# Patient Record
Sex: Male | Born: 1955 | Hispanic: No | Marital: Married | Smoking: Never smoker
Health system: Southern US, Community
[De-identification: ages and names within clinical notes are randomized; demographics above are authoritative.]

## PROBLEM LIST (undated history)

## (undated) DIAGNOSIS — C801 Malignant (primary) neoplasm, unspecified: Secondary | ICD-10-CM

## (undated) DIAGNOSIS — G4730 Sleep apnea, unspecified: Secondary | ICD-10-CM

## (undated) DIAGNOSIS — Z87442 Personal history of urinary calculi: Secondary | ICD-10-CM

## (undated) DIAGNOSIS — E039 Hypothyroidism, unspecified: Secondary | ICD-10-CM

## (undated) DIAGNOSIS — I10 Essential (primary) hypertension: Secondary | ICD-10-CM

## (undated) DIAGNOSIS — K219 Gastro-esophageal reflux disease without esophagitis: Secondary | ICD-10-CM

## (undated) DIAGNOSIS — M1990 Unspecified osteoarthritis, unspecified site: Secondary | ICD-10-CM

## (undated) DIAGNOSIS — I499 Cardiac arrhythmia, unspecified: Secondary | ICD-10-CM

## (undated) HISTORY — PX: KNEE CARTILAGE SURGERY: SHX688

## (undated) HISTORY — PX: CARDIAC CATHETERIZATION: SHX172

## (undated) HISTORY — PX: PROSTATE SURGERY: SHX751

## (undated) HISTORY — PX: CHOLECYSTECTOMY: SHX55

## (undated) HISTORY — PX: APPENDECTOMY: SHX54

---

## 2017-10-05 ENCOUNTER — Other Ambulatory Visit: Payer: Self-pay | Admitting: Orthopedic Surgery

## 2017-11-22 NOTE — Pre-Procedure Instructions (Signed)
[facility]  11/22/2017      [facility] [place] [address] [place] [address] [place] [address] Phone: [phone] Fax: [phone]  [facility] [place], [place] [address] [place] [address] Phone: [phone] Fax: [phone]    Your procedure is scheduled on  Thursday 11/29/17  Report to [place] at 800 A.M.  Call this number if you have problems the morning of surgery:  [phone]   Remember:  Do not eat food or drink liquids after midnight.  Take these medicines the morning of surgery with A SIP OF WATER  ALPRAZOLAM, AMLODIPINE (NORVASC), GABAPENTIN (NEURONTIN), LEVOTHYROXINE, OMEPRAZOLE, PREDNISONE   7 days prior to surgery STOP taking any Aspirin(unless otherwise instructed by your surgeon), Aleve, Naproxen, Ibuprofen, Motrin, Advil, Goody's, BC's, all herbal medications, fish oil, and all vitamins    Do not wear jewelry, make-up or nail polish.  Do not wear lotions, powders, or perfumes, or deodorant.  Do not shave 48 hours prior to surgery.  Men may shave face and neck.  Do not bring valuables to the [facility].  [facility] is not responsible for any belongings or valuables.  Contacts, dentures or bridgework may not be worn into surgery.  Leave your suitcase in the car.  After surgery it may be brought to your room.  For patients admitted to the [facility], discharge time will be determined by your treatment team.  Patients discharged the day of surgery will not be allowed to drive home.   Name and phone number of your driver:    Special instructions:  [facility] - Preparing for Surgery  Before surgery, you can play an important role.  Because skin is not sterile, your skin needs to be as free of germs as possible.  You can reduce the number of germs on you skin by washing with CHG (chlorahexidine gluconate) soap before  surgery.  CHG is an antiseptic cleaner which kills germs and bonds with the skin to continue killing germs even after washing.  Please DO NOT use if you have an allergy to CHG or antibacterial soaps.  If your skin becomes reddened/irritated stop using the CHG and inform your nurse when you arrive at Short Stay.  Do not shave (including legs and underarms) for at least 48 hours prior to the first CHG shower.  You may shave your face.  Please follow these instructions carefully:   1.  Shower with CHG Soap the night before surgery and the                                morning of Surgery.  2.  If you choose to wash your hair, wash your hair first as usual with your       normal shampoo.  3.  After you shampoo, rinse your hair and body thoroughly to remove the                      Shampoo.  4.  Use CHG as you would any other liquid soap.  You can apply chg directly       to the skin and wash gently with scrungie or a clean washcloth.  5.  Apply the CHG Soap to your body ONLY FROM THE NECK DOWN.  Do not use on open wounds or open sores.  Avoid contact with your eyes,       ears, mouth and genitals (private parts).  Wash genitals (private parts)       with your normal soap.  6.  Wash thoroughly, paying special attention to the area where your surgery        will be performed.  7.  Thoroughly rinse your body with warm water from the neck down.  8.  DO NOT shower/wash with your normal soap after using and rinsing off       the CHG Soap.  9.  Pat yourself dry with a clean towel.            10.  Wear clean pajamas.            11.  Place clean sheets on your bed the night of your first shower and do not        sleep with pets.  Day of Surgery  Do not apply any lotions/deoderants the morning of surgery.  Please wear clean clothes to the [facility]/surgery center.    Please read over the following fact sheets that you were given. MRSA Information and Surgical Site Infection Prevention

## 2017-11-23 ENCOUNTER — Encounter (HOSPITAL_COMMUNITY)
Admission: RE | Admit: 2017-11-23 | Discharge: 2017-11-23 | Disposition: A | Discharge status: Home / Self Care | Payer: Worker's Compensation | Source: Ambulatory Visit | Attending: Orthopedic Surgery | Admitting: Orthopedic Surgery

## 2017-11-23 ENCOUNTER — Other Ambulatory Visit: Payer: Self-pay

## 2017-11-23 ENCOUNTER — Encounter (HOSPITAL_COMMUNITY): Payer: Self-pay

## 2017-11-23 ENCOUNTER — Ambulatory Visit (HOSPITAL_COMMUNITY)
Admission: RE | Admit: 2017-11-23 | Discharge: 2017-11-23 | Disposition: A | Discharge status: Home / Self Care | Payer: Worker's Compensation | Source: Ambulatory Visit | Attending: Orthopedic Surgery | Admitting: Orthopedic Surgery

## 2017-11-23 DIAGNOSIS — Z01818 Encounter for other preprocedural examination: Secondary | ICD-10-CM

## 2017-11-23 DIAGNOSIS — Z7982 Long term (current) use of aspirin: Secondary | ICD-10-CM | POA: Insufficient documentation

## 2017-11-23 DIAGNOSIS — M1990 Unspecified osteoarthritis, unspecified site: Secondary | ICD-10-CM | POA: Insufficient documentation

## 2017-11-23 DIAGNOSIS — Z8546 Personal history of malignant neoplasm of prostate: Secondary | ICD-10-CM | POA: Diagnosis not present

## 2017-11-23 DIAGNOSIS — Z9049 Acquired absence of other specified parts of digestive tract: Secondary | ICD-10-CM | POA: Diagnosis not present

## 2017-11-23 DIAGNOSIS — I10 Essential (primary) hypertension: Secondary | ICD-10-CM | POA: Insufficient documentation

## 2017-11-23 DIAGNOSIS — K219 Gastro-esophageal reflux disease without esophagitis: Secondary | ICD-10-CM | POA: Insufficient documentation

## 2017-11-23 DIAGNOSIS — Z79899 Other long term (current) drug therapy: Secondary | ICD-10-CM | POA: Diagnosis not present

## 2017-11-23 DIAGNOSIS — E039 Hypothyroidism, unspecified: Secondary | ICD-10-CM | POA: Diagnosis not present

## 2017-11-23 DIAGNOSIS — Z87442 Personal history of urinary calculi: Secondary | ICD-10-CM | POA: Diagnosis not present

## 2017-11-23 DIAGNOSIS — G4733 Obstructive sleep apnea (adult) (pediatric): Secondary | ICD-10-CM | POA: Diagnosis not present

## 2017-11-23 DIAGNOSIS — Z6841 Body Mass Index (BMI) 40.0 and over, adult: Secondary | ICD-10-CM | POA: Insufficient documentation

## 2017-11-23 DIAGNOSIS — R9431 Abnormal electrocardiogram [ECG] [EKG]: Secondary | ICD-10-CM | POA: Diagnosis not present

## 2017-11-23 DIAGNOSIS — I493 Ventricular premature depolarization: Secondary | ICD-10-CM | POA: Diagnosis not present

## 2017-11-23 HISTORY — DX: Hypothyroidism, unspecified: E03.9

## 2017-11-23 HISTORY — DX: Unspecified osteoarthritis, unspecified site: M19.90

## 2017-11-23 HISTORY — DX: Essential (primary) hypertension: I10

## 2017-11-23 HISTORY — DX: Personal history of urinary calculi: Z87.442

## 2017-11-23 HISTORY — DX: Malignant (primary) neoplasm, unspecified: C80.1

## 2017-11-23 HISTORY — DX: Gastro-esophageal reflux disease without esophagitis: K21.9

## 2017-11-23 HISTORY — DX: Sleep apnea, unspecified: G47.30

## 2017-11-23 HISTORY — DX: Cardiac arrhythmia, unspecified: I49.9

## 2017-11-23 LAB — TYPE AND SCREEN
ABO/RH(D): B POS
ANTIBODY SCREEN: NEGATIVE

## 2017-11-23 LAB — COMPREHENSIVE METABOLIC PANEL
ALBUMIN: 3.9 g/dL (ref 3.5–5.0)
ALT: 32 U/L (ref 17–63)
ANION GAP: 11 (ref 5–15)
AST: 28 U/L (ref 15–41)
Alkaline Phosphatase: 75 U/L (ref 38–126)
BILIRUBIN TOTAL: 0.9 mg/dL (ref 0.3–1.2)
BUN: 15 mg/dL (ref 6–20)
CHLORIDE: 102 mmol/L (ref 101–111)
CO2: 25 mmol/L (ref 22–32)
Calcium: 9.3 mg/dL (ref 8.9–10.3)
Creatinine, Ser: 0.93 mg/dL (ref 0.61–1.24)
GFR calc Af Amer: >60 mL/min (ref >60)
Glucose, Bld: 120 mg/dL — ABNORMAL HIGH (ref 65–99)
POTASSIUM: 3.4 mmol/L — AB (ref 3.5–5.1)
Sodium: 138 mmol/L (ref 135–145)
TOTAL PROTEIN: 7.1 g/dL (ref 6.5–8.1)

## 2017-11-23 LAB — CBC WITH DIFFERENTIAL/PLATELET
BASOS ABS: 0 10*3/uL (ref 0.0–0.1)
BASOS PCT: 0 %
Eosinophils Absolute: 0.2 10*3/uL (ref 0.0–0.7)
Eosinophils Relative: 2 %
HEMATOCRIT: 46.3 % (ref 39.0–52.0)
Hemoglobin: 16.1 g/dL (ref 13.0–17.0)
Lymphocytes Relative: 20 %
Lymphs Abs: 2.1 10*3/uL (ref 0.7–4.0)
MCH: 31.5 pg (ref 26.0–34.0)
MCHC: 34.8 g/dL (ref 30.0–36.0)
MCV: 90.6 fL (ref 78.0–100.0)
MONO ABS: 0.6 10*3/uL (ref 0.1–1.0)
MONOS PCT: 6 %
NEUTROS ABS: 7.7 10*3/uL (ref 1.7–7.7)
Neutrophils Relative %: 72 %
PLATELETS: 250 10*3/uL (ref 150–400)
RBC: 5.11 MIL/uL (ref 4.22–5.81)
RDW: 13.9 % (ref 11.5–15.5)
WBC: 10.5 10*3/uL (ref 4.0–10.5)

## 2017-11-23 LAB — SURGICAL PCR SCREEN
MRSA, PCR: NEGATIVE
STAPHYLOCOCCUS AUREUS: NEGATIVE

## 2017-11-23 LAB — APTT: APTT: 30 s (ref 24–36)

## 2017-11-23 LAB — PROTIME-INR
INR: 1.04
PROTHROMBIN TIME: 13.5 s (ref 11.4–15.2)

## 2017-11-23 LAB — ABO/RH: ABO/RH(D): B POS

## 2017-11-26 ENCOUNTER — Encounter (HOSPITAL_COMMUNITY): Payer: Self-pay | Admitting: Vascular Surgery

## 2017-11-26 ENCOUNTER — Encounter (HOSPITAL_COMMUNITY): Payer: Self-pay

## 2017-11-26 NOTE — Progress Notes (Signed)
Anesthesia Chart Review: Patient is a 62 year old male scheduled for ACDF C5-6, C6-7 on 11/29/17 by Dr. Phylliss Bob. (Based on orthopedic notes, he may also need upcoming left rotator cuff surgery with Dr. Tamera Punt in the future.)  History includes never smoker, HTN, hypothyroidism, OSA (CPAP), GERD, prostate cancer, arthritis, nephrolithiasis, palpitations, appendectomy, cholecystectomy. BMI is consistent with morbid obesity.   - PCP is listed as Dr. Leia Alf.  - Cardiologist is Dr. Mayra Neer with [facility] in [place], [place] [phone]). Last visit 09/18/17. He signed a note of clearance indicating patient at "low risk" and may safely hold ASA 5-7 days prior to surgery.   Meds include Xanax, amlodipine, aspirin 81 mg (instructed at PAT, hold 7 days unless otherwise instructed by surgeon), Lipitor, Flexeril, Lasix, gabapentin, levothyroxine, losartan-HCTZ, Prilosec, prednisone 10 mg TID (11/20/17: written as "temporary course & will complete by end of the week").   BP (!) 141/83   Pulse 86   Temp 36.9 C   Resp 20   Ht 5\' 7"  (1.702 m)   Wt 293 lb (132.9 kg)   SpO2 96%   BMI 45.89 kg/m   EKG 11/23/17: SR with occasional PVCs, inferior infarct (age undetermined).  Cardiac cath 08/31/17 (done for "Abnormal EKG suggesting inferior infarct and nuclear imaging showing inferior ischemia"): Final Impression: Normal coronary arteries. No pressure difference is noted across the aortic valve. False positive Cardiolite testing. (No LVEF is documented on LHC, but was 56% by 07/2017 Cardiolite.)  By 09/18/17 [facility] cardiology office note, 07/2017 Cardiolite showed: 1.  Abnormal stress MPI with ischemia involving the inferior wall. 2.  LVEF 56% with normal wall motion. 3.  Some severity score of 7 predicted and a medium probability of future events.  Total LV ischemia 10%. 4.  There are no prior studies for direct comparison. Discussion: Technically very good study.   Findings consistent with mild inferior wall ischemia from the apex to the base of the heart.  Total LV involvement 10%.  Would recommend consideration of cardiac catheterization and revascularization if feasible.  CXR 11/23/17: IMPRESSION: No active cardiopulmonary disease.  MRI c-spine 06/30/17 ([place]): IMPRESSION:  1. Multilevel cervical spondylosis with exaggerated cervical lordosis and partially demonstrated thoracic curvature. Detailed assessment limited by motion artifact.  2. Similar-appearing moderate central canal stenosis and moderate bilateral foraminal stenosis, left greater than right, at C5-C6. 3. Similar-appearing moderate left foraminal stenosis at C6-C7.  Sleep Study 10/05/16 [facility], Dr. Corwin Levins): Findings are consistent with severe OSA.    Preoperative labs noted. Cr 0.93. K 3.4. Glucose 120. CBC, PT/PTT WNL. T&S done. UA specimen was rejected by the labs. Needs new urine specimen   If no acute changes then I would anticipate that he can proceed as planned.  George Hugh [facility] Short Stay Center/Anesthesiology Phone [phone] 11/26/2017 12:52 PM

## 2017-11-28 MED ORDER — SODIUM CHLORIDE 0.9 % IV SOLN
1500.0000 mg | INTRAVENOUS | Status: DC
  Filled 2017-11-28: qty 1500

## 2017-11-29 ENCOUNTER — Ambulatory Visit (HOSPITAL_COMMUNITY)
Admission: RE | Admit: 2017-11-29 | Payer: Worker's Compensation | Source: Ambulatory Visit | Admitting: Orthopedic Surgery

## 2017-11-29 ENCOUNTER — Encounter (HOSPITAL_COMMUNITY): Admission: RE | Payer: Self-pay | Source: Ambulatory Visit

## 2017-11-29 SURGERY — ANTERIOR CERVICAL DECOMPRESSION/DISCECTOMY FUSION 2 LEVELS
Anesthesia: General

## 2018-03-04 ENCOUNTER — Other Ambulatory Visit: Payer: Self-pay | Admitting: Orthopedic Surgery

## 2018-03-13 ENCOUNTER — Inpatient Hospital Stay (HOSPITAL_COMMUNITY)
Admission: RE | Admit: 2018-03-13 | Discharge: 2018-03-13 | Disposition: A | Discharge status: Home / Self Care | Payer: Self-pay | Source: Ambulatory Visit

## 2018-03-13 ENCOUNTER — Other Ambulatory Visit (HOSPITAL_COMMUNITY): Payer: Self-pay | Admitting: *Deleted

## 2018-03-13 NOTE — Pre-Procedure Instructions (Signed)
[facility]  03/13/2018    Your procedure is scheduled on Thursday, Mar 21, 2018 at 11:00 AM.   Report to [facility] Entrance "A" Admitting Office at 8:00 AM.   Call this number if you have problems the morning of surgery: [phone]   Questions prior to day of surgery, please call [phone] between 8 & 4 PM.   Remember:  Do not eat food or drink liquids after midnight Wednesday, 03/20/18.  Take these medicines the morning of surgery with A SIP OF WATER: Levothyroxine (Synthroid), Prednisone, Pregabalin (Lyrica), Rabeprazole (Aciphex), Vortioxetine (Trintellix), Tramadol - if needed, Alprazolam (Xanax) - if needed  Stop Aspirin as instructed by cardiologist. Stop NSAIDS (Naprosyn, Ibuprofen, Aleve, etc), Krill Oil and Herbal medications as of today.    Do not wear jewelry.  Do not wear lotions, powders, cologne or deodorant.  Men may shave face and neck.  Do not bring valuables to the hospital.  [facility] is not responsible for any belongings or valuables.  Contacts, dentures or bridgework may not be worn into surgery.  Leave your suitcase in the car.  After surgery it may be brought to your room.  For patients admitted to the hospital, discharge time will be determined by your treatment team.  [facility] - Preparing for Surgery  Before surgery, you can play an important role.  Because skin is not sterile, your skin needs to be as free of germs as possible.  You can reduce the number of germs on you skin by washing with CHG (chlorahexidine gluconate) soap before surgery.  CHG is an antiseptic cleaner which kills germs and bonds with the skin to continue killing germs even after washing.  Oral Hygiene is also important in reducing the risk of infection.  Remember to brush your teeth the morning of surgery.  Please DO NOT use if you have an allergy to CHG or antibacterial soaps.  If your skin becomes reddened/irritated stop using the CHG and inform your nurse when  you arrive at Short Stay.  Do not shave (including legs and underarms) for at least 48 hours prior to the first CHG shower.  You may shave your face.  Please follow these instructions carefully:   1.  Shower with CHG Soap the night before surgery and the morning of Surgery.  2.  If you choose to wash your hair, wash your hair first as usual with your normal shampoo.  3.  After you shampoo, rinse your hair and body thoroughly to remove the shampoo. 4.  Use CHG as you would any other liquid soap.  You can apply chg directly to the skin and wash gently with a      scrungie or washcloth.           5.  Apply the CHG Soap to your body ONLY FROM THE NECK DOWN.   Do not use on open wounds or open sores. Avoid contact with your eyes, ears, mouth and genitals (private parts).  Wash genitals (private parts) with your normal soap.  6.  Wash thoroughly, paying special attention to the area where your surgery will be performed.  7.  Thoroughly rinse your body with warm water from the neck down.  8.  DO NOT shower/wash with your normal soap after using and rinsing off the CHG Soap.  9.  Pat yourself dry with a clean towel.            10.  Wear clean pajamas.  11.  Place clean sheets on your bed the night of your first shower and do not sleep with pets.  Day of Surgery  Shower as above. Do not apply any lotions/deoderants the morning of surgery.   Please wear clean clothes to the hospital/surgery center. Remember to brush your teeth.   Please read over the fact sheets that you were given.

## 2018-03-15 ENCOUNTER — Other Ambulatory Visit: Payer: Self-pay

## 2018-03-15 ENCOUNTER — Encounter (HOSPITAL_COMMUNITY)
Admission: RE | Admit: 2018-03-15 | Discharge: 2018-03-15 | Disposition: A | Discharge status: Home / Self Care | Payer: No Typology Code available for payment source | Source: Ambulatory Visit | Attending: Orthopedic Surgery | Admitting: Orthopedic Surgery

## 2018-03-15 ENCOUNTER — Encounter (HOSPITAL_COMMUNITY): Payer: Self-pay

## 2018-03-15 DIAGNOSIS — Z01812 Encounter for preprocedural laboratory examination: Secondary | ICD-10-CM | POA: Diagnosis present

## 2018-03-15 LAB — SURGICAL PCR SCREEN
MRSA, PCR: NEGATIVE
STAPHYLOCOCCUS AUREUS: NEGATIVE

## 2018-03-15 LAB — COMPREHENSIVE METABOLIC PANEL
ALBUMIN: 3.7 g/dL (ref 3.5–5.0)
ALT: 28 U/L (ref 17–63)
ANION GAP: 10 (ref 5–15)
AST: 19 U/L (ref 15–41)
Alkaline Phosphatase: 70 U/L (ref 38–126)
BUN: 19 mg/dL (ref 6–20)
CALCIUM: 9.3 mg/dL (ref 8.9–10.3)
CHLORIDE: 100 mmol/L — AB (ref 101–111)
CO2: 26 mmol/L (ref 22–32)
Creatinine, Ser: 0.91 mg/dL (ref 0.61–1.24)
GFR calc non Af Amer: >60 mL/min (ref >60)
Glucose, Bld: 148 mg/dL — ABNORMAL HIGH (ref 65–99)
POTASSIUM: 4.6 mmol/L (ref 3.5–5.1)
SODIUM: 136 mmol/L (ref 135–145)
Total Bilirubin: 1.1 mg/dL (ref 0.3–1.2)
Total Protein: 6.7 g/dL (ref 6.5–8.1)

## 2018-03-15 LAB — PROTIME-INR
INR: 1.01
Prothrombin Time: 13.2 seconds (ref 11.4–15.2)

## 2018-03-15 LAB — CBC WITH DIFFERENTIAL/PLATELET
Abs Immature Granulocytes: 0.1 10*3/uL (ref 0.0–0.1)
BASOS ABS: 0 10*3/uL (ref 0.0–0.1)
BASOS PCT: 0 %
Eosinophils Absolute: 0 10*3/uL (ref 0.0–0.7)
Eosinophils Relative: 0 %
HCT: 48.9 % (ref 39.0–52.0)
Hemoglobin: 16.3 g/dL (ref 13.0–17.0)
Immature Granulocytes: 1 %
LYMPHS PCT: 10 %
Lymphs Abs: 1.3 10*3/uL (ref 0.7–4.0)
MCH: 30.1 pg (ref 26.0–34.0)
MCHC: 33.3 g/dL (ref 30.0–36.0)
MCV: 90.4 fL (ref 78.0–100.0)
MONO ABS: 0.6 10*3/uL (ref 0.1–1.0)
Monocytes Relative: 5 %
Neutro Abs: 10.4 10*3/uL — ABNORMAL HIGH (ref 1.7–7.7)
Neutrophils Relative %: 84 %
PLATELETS: 266 10*3/uL (ref 150–400)
RBC: 5.41 MIL/uL (ref 4.22–5.81)
RDW: 13.8 % (ref 11.5–15.5)
WBC: 12.3 10*3/uL — AB (ref 4.0–10.5)

## 2018-03-15 LAB — URINALYSIS, ROUTINE W REFLEX MICROSCOPIC
Bilirubin Urine: NEGATIVE
Glucose, UA: NEGATIVE mg/dL
Hgb urine dipstick: NEGATIVE
KETONES UR: NEGATIVE mg/dL
LEUKOCYTES UA: NEGATIVE
NITRITE: NEGATIVE
PH: 8 (ref 5.0–8.0)
Protein, ur: NEGATIVE mg/dL
SPECIFIC GRAVITY, URINE: 1.012 (ref 1.005–1.030)

## 2018-03-15 LAB — APTT: APTT: 26 s (ref 24–36)

## 2018-03-15 LAB — TYPE AND SCREEN
ABO/RH(D): B POS
Antibody Screen: NEGATIVE

## 2018-03-15 NOTE — Progress Notes (Signed)
GAGE WEANT            03/13/2018              Your procedure is scheduled on Thursday, Mar 21, 2018 at 2:00 PM- 5:03 PM             Report to [facility] Entrance "A" Admitting Office at 12:00 PM.             Call this number if you have problems the morning of surgery: [phone]             Questions prior to day of surgery, please call [phone] between 8 & 4 PM.             Remember:            Do not eat food or drink liquids after midnight Wednesday, 03/20/18.            Take these medicines the morning of surgery with A SIP OF WATER: Levothyroxine (Synthroid), Prednisone, Pregabalin (Lyrica), Rabeprazole (Aciphex), Vortioxetine (Trintellix), Tramadol - if needed, Alprazolam (Xanax) - if needed  Stop Aspirin as instructed by cardiologist. Stop NSAIDS (Naprosyn, Ibuprofen, Aleve, etc), Krill Oil and Herbal medications as of today.              Do not wear jewelry.            Do not wear lotions, powders, cologne or deodorant.            Men may shave face and neck.            Do not bring valuables to the hospital.            [facility] is not responsible for any belongings or valuables.  Contacts, dentures or bridgework may not be worn into surgery.  Leave your suitcase in the car.  After surgery it may be brought to your room.  For patients admitted to the hospital, discharge time will be determined by your treatment team.  [facility] - Preparing for Surgery  Before surgery, you can play an important role.  Because skin is not sterile, your skin needs to be as free of germs as possible.  You can reduce the number of germs on you skin by washing with CHG (chlorahexidine gluconate) soap before surgery.  CHG is an antiseptic cleaner which kills germs and bonds with the skin to continue killing germs even after washing.  Oral Hygiene is also important in reducing the risk of infection.  Remember to brush your teeth the morning of surgery.  Please DO  NOT use if you have an allergy to CHG or antibacterial soaps.  If your skin becomes reddened/irritated stop using the CHG and inform your nurse when you arrive at Short Stay.  Do not shave (including legs and underarms) for at least 48 hours prior to the first CHG shower.  You may shave your face.  Please follow these instructions carefully:             1.  Shower with CHG Soap the night before surgery and the morning of Surgery.            2.  If you choose to wash your hair, wash your hair first as usual with your normal shampoo.            3.  After you shampoo, rinse your hair and body thoroughly to remove the shampoo. 4.  Use CHG as  you would any other liquid soap.  You can apply chg directly to the skin and wash gently with a      scrungie or washcloth.           5.  Apply the CHG Soap to your body ONLY FROM THE NECK DOWN.   Do not use on open wounds or open sores. Avoid contact with your eyes, ears, mouth and genitals (private parts).  Wash genitals (private parts) with your normal soap.            6.  Wash thoroughly, paying special attention to the area where your surgery will be performed.            7.  Thoroughly rinse your body with warm water from the neck down.            8.  DO NOT shower/wash with your normal soap after using and rinsing off the CHG Soap.            9.  Pat yourself dry with a clean towel.            10.  Wear clean pajamas.            11.  Place clean sheets on your bed the night of your first shower and do not sleep with pets.  Day of Surgery  Shower as above. Do not apply any lotions/deoderants the morning of surgery.   Please wear clean clothes to the hospital/surgery center. Remember to brush your teeth.   Please read over the fact sheets that you were given.

## 2018-03-15 NOTE — Progress Notes (Signed)
PCP - Dr. Leia Alf  Cardiologist - Dr. Marya Fossa  Chest x-ray - 11/23/17 (E)  EKG - 11/23/17 (E)  Stress Test - 09/18/17 in chart  ECHO - Denies  Cardiac Cath - 08/31/17 in chart  Sleep Study - Yes- Positive CPAP - Yes- Told to bring mask DOS  LABS- 03/15/18; CBC w/D, CMP, PT ,PTT, T/S, UA  ASA- LD- 5/14   Anesthesia- Yes- Previous note 11/23/17  Pt denies having chest pain, sob, or fever at this time. All instructions explained to the pt, with a verbal understanding of the material. Pt agrees to go over the instructions while at home for a better understanding. The opportunity to ask questions was provided.

## 2018-03-18 ENCOUNTER — Encounter (HOSPITAL_COMMUNITY): Payer: Self-pay

## 2018-03-18 NOTE — Progress Notes (Signed)
Anesthesia Chart Review:  Case:  [id number] Date/Time:  03/21/18 1200   Procedure:  ANTERIOR CERVICAL DECOMPRESSION FUSION, CERVICAL 5-6, CERVICAL 6-7 WITH INSTRUMENTATION AND ALLOGRAFT  TIME REQUESTED 3.5 HOURS (N/A )   Anesthesia type:  General   Pre-op diagnosis:  LEFT ARM PAIN AND NUMBNESS   Location:  [facility] OR ROOM 18 / [place] OR   Surgeon:  Phylliss Bob, MD      DISCUSSION: Patient is a 62 year old male scheduled for the above procedure. It was initially scheduled for 11/29/17, but patient reported he developed a bile duct infection and had be treated with antibiotics and also had a colonoscopy with polypectomy. Surgery is now scheduled for 03/21/18.    History includes never smoker, HTN, palpitations, hypothyroidism, OSA (CPAP), GERD, prostate cancer (s/p high intensity ultrasound in [place] '09). BMI is consistent with morbid obesity.   Patient has cardiac clearance from Dr. Mayra Neer. Last ASA dose 03/12/18. If no acute changes then I anticipate that he can proceed as planned. Of note, I called him to inquire about prednisone (listed as taking 10 mg QID). He reports that he is currently taking for back and knee pain. He indicates that he will ultimately need left rotator cuff surgery, right total knee replacement and back surgery in the near future. He has actually been taking prednisone 10 mg 2 tabs po Q HS for about two weeks now. He believes it was initially prescribed by Dr. Lynann Bologna, but last prescribed by his PCP. I left a voice message with Butch Penny at Dr. Laurena Bering office that patient is currently on prednisone. Will defer to anesthesiologist if patient will need any stress steroids for surgery/anesthesia.    VS: BP (!) 147/79   Pulse (!) 58   Temp 37 C   Resp 20   Ht 5\' 7"  (1.702 m)   Wt 289 lb 4.8 oz (131.2 kg)   SpO2 96%   BMI 45.31 kg/m   PROVIDERS: - PCP is Dr. Leia Alf, but he primarily sees Shawnie Dapper, NP. - Cardiologist is Dr. Mayra Neer with [facility] in [place], [place] [phone]). Last visit 09/18/17. He signed a note of clearance indicating patient at "low risk" and may safely hold ASA 5-7 days prior to surgery.    LABS: Labs reviewed: Acceptable for surgery. (all labs ordered are listed, but only abnormal results are displayed)  Labs Reviewed  CBC WITH DIFFERENTIAL/PLATELET - Abnormal; Notable for the following components:      Result Value   WBC 12.3 (*)    Neutro Abs 10.4 (*)    All other components within normal limits  COMPREHENSIVE METABOLIC PANEL - Abnormal; Notable for the following components:   Chloride 100 (*)    Glucose, Bld 148 (*)    All other components within normal limits  URINALYSIS, ROUTINE W REFLEX MICROSCOPIC - Abnormal; Notable for the following components:   APPearance CLOUDY (*)    All other components within normal limits  SURGICAL PCR SCREEN  APTT  PROTIME-INR  TYPE AND SCREEN    IMAGES: CXR 11/23/17: IMPRESSION: No active cardiopulmonary disease.  MRI c-spine 06/30/17 ([place]): IMPRESSION:  1. Multilevel cervical spondylosis with exaggerated cervical lordosis and partially demonstrated thoracic curvature. Detailed assessment limited by motion artifact.  2. Similar-appearing moderate central canal stenosis and moderate bilateral foraminal stenosis, left greater than right, at C5-C6. 3. Similar-appearing moderate left foraminal stenosis at C6-C7.  OTHER:  Sleep Study 10/05/16 [facility]rology, Dr. Corwin Levins): Findings are consistent with  severe OSA.    EKG: EKG 11/23/17: SR with occasional PVCs, inferior infarct (age undetermined).   CV: Cardiac cath 08/31/17 (Dr Mayra Neer; done for "Abnormal EKG suggesting inferior infarct and nuclear imaging showing inferior ischemia"): Final Impression: Normal coronary arteries. No pressure difference is noted across the aortic valve. False positive Cardiolite testing. (No LVEF is documented on LHC, but was 56% by  07/2017 Cardiolite.)  By 09/18/17 [facility] cardiology office note, 07/2017 Cardiolite showed: 1.  Abnormal stress MPI with ischemia involving the inferior wall. 2.  LVEF 56% with normal wall motion. 3.  Some severity score of 7 predicted and a medium probability of future events.  Total LV ischemia 10%. 4.  There are no prior studies for direct comparison. Discussion: Technically very good study.  Findings consistent with mild inferior wall ischemia from the apex to the base of the heart.  Total LV involvement 10%.  Would recommend consideration of cardiac catheterization and revascularization if feasible.   Past Medical History:  Diagnosis Date  . Arthritis   . Cancer ([place])    PROSTATE  . Dysrhythmia    PALPITATION  . GERD (gastroesophageal reflux disease)   . History of kidney stones   . Hypertension   . Hypothyroidism   . Sleep apnea    CPAP       Past Surgical History:  Procedure Laterality Date  . APPENDECTOMY    . CARDIAC CATHETERIZATION     08/31/17 LHC (Dr. Mayra Neer): Normal coronaries, false positive Cardiolite. (EF 56% by 07/2017 Cardiolilte)  . CHOLECYSTECTOMY    . KNEE CARTILAGE SURGERY     RT KNEE '16  . PROSTATE SURGERY      HIGH INTENSITY US  DONE IN [place] '09    MEDICATIONS: . acetaminophen-codeine (TYLENOL #3) 300-30 MG tablet  . ALPRAZolam (XANAX) 0.25 MG tablet  . aluminum-magnesium hydroxide 200-200 MG/5ML suspension  . amLODipine (NORVASC) 5 MG tablet  . Ascorbic Acid (VITAMIN C) 1000 MG tablet  . ASPERCREME LIDOCAINE EX  . aspirin EC 81 MG tablet  . atorvastatin (LIPITOR) 10 MG tablet  . b complex vitamins tablet  . cholestyramine (QUESTRAN) 4 GM/DOSE powder  . cyclobenzaprine (FLEXERIL) 5 MG tablet  . fexofenadine (ALLEGRA) 180 MG tablet  . furosemide (LASIX) 20 MG tablet  . Glucosamine-Chondroitin (OSTEO BI-FLEX REGULAR STRENGTH PO)  . Ibuprofen-Famotidine (DUEXIS) 800-26.6 MG TABS  . Krill Oil 350 MG CAPS  .  levothyroxine (SYNTHROID, LEVOTHROID) 100 MCG tablet  . Misc Natural Products (TURMERIC CURCUMIN) CAPS  . naproxen (NAPROSYN) 500 MG tablet  . olmesartan-hydrochlorothiazide (BENICAR HCT) 40-25 MG tablet  . predniSONE (DELTASONE) 10 MG tablet  . pregabalin (LYRICA) 75 MG capsule  . RABEprazole (ACIPHEX) 20 MG tablet  . traMADol (ULTRAM) 50 MG tablet  . Triamcinolone Acetonide (NASACORT AQ NA)  . vortioxetine HBr (TRINTELLIX) 5 MG TABS tablet  . Witch Hazel (PREPARATION H EX)   No current facility-administered medications for this encounter.    George Hugh [facility] Short Stay Center/Anesthesiology Phone [phone] 03/18/2018 10:33 AM

## 2018-03-20 MED ORDER — VANCOMYCIN HCL 10 G IV SOLR
1500.0000 mg | INTRAVENOUS | Status: AC
  Administered 2018-03-21: 1500 mg via INTRAVENOUS
  Filled 2018-03-20: qty 1500

## 2018-03-21 ENCOUNTER — Encounter (HOSPITAL_COMMUNITY): Payer: Self-pay | Admitting: General Practice

## 2018-03-21 ENCOUNTER — Ambulatory Visit (HOSPITAL_COMMUNITY): Payer: No Typology Code available for payment source | Admitting: Vascular Surgery

## 2018-03-21 ENCOUNTER — Encounter (HOSPITAL_COMMUNITY)
Admission: RE | Disposition: A | Discharge status: Home / Self Care | Payer: Self-pay | Source: Ambulatory Visit | Attending: Orthopedic Surgery

## 2018-03-21 ENCOUNTER — Ambulatory Visit (HOSPITAL_COMMUNITY): Payer: No Typology Code available for payment source

## 2018-03-21 ENCOUNTER — Other Ambulatory Visit: Payer: Self-pay

## 2018-03-21 ENCOUNTER — Observation Stay (HOSPITAL_COMMUNITY)
Admission: RE | Admit: 2018-03-21 | Discharge: 2018-03-22 | Disposition: A | Discharge status: Home / Self Care | Payer: No Typology Code available for payment source | Source: Ambulatory Visit | Attending: Orthopedic Surgery | Admitting: Orthopedic Surgery

## 2018-03-21 ENCOUNTER — Ambulatory Visit (HOSPITAL_COMMUNITY): Payer: No Typology Code available for payment source | Admitting: Anesthesiology

## 2018-03-21 DIAGNOSIS — Z888 Allergy status to other drugs, medicaments and biological substances status: Secondary | ICD-10-CM | POA: Insufficient documentation

## 2018-03-21 DIAGNOSIS — Z419 Encounter for procedure for purposes other than remedying health state, unspecified: Secondary | ICD-10-CM

## 2018-03-21 DIAGNOSIS — M4802 Spinal stenosis, cervical region: Secondary | ICD-10-CM | POA: Diagnosis not present

## 2018-03-21 DIAGNOSIS — M5412 Radiculopathy, cervical region: Secondary | ICD-10-CM | POA: Diagnosis not present

## 2018-03-21 DIAGNOSIS — K219 Gastro-esophageal reflux disease without esophagitis: Secondary | ICD-10-CM | POA: Insufficient documentation

## 2018-03-21 DIAGNOSIS — I10 Essential (primary) hypertension: Secondary | ICD-10-CM | POA: Insufficient documentation

## 2018-03-21 DIAGNOSIS — G4730 Sleep apnea, unspecified: Secondary | ICD-10-CM | POA: Insufficient documentation

## 2018-03-21 DIAGNOSIS — Z8546 Personal history of malignant neoplasm of prostate: Secondary | ICD-10-CM | POA: Insufficient documentation

## 2018-03-21 DIAGNOSIS — M795 Residual foreign body in soft tissue: Secondary | ICD-10-CM

## 2018-03-21 DIAGNOSIS — Z79899 Other long term (current) drug therapy: Secondary | ICD-10-CM | POA: Diagnosis not present

## 2018-03-21 DIAGNOSIS — Z7982 Long term (current) use of aspirin: Secondary | ICD-10-CM | POA: Diagnosis not present

## 2018-03-21 DIAGNOSIS — Z882 Allergy status to sulfonamides status: Secondary | ICD-10-CM | POA: Diagnosis not present

## 2018-03-21 DIAGNOSIS — Z880 Allergy status to penicillin: Secondary | ICD-10-CM | POA: Insufficient documentation

## 2018-03-21 DIAGNOSIS — Z87442 Personal history of urinary calculi: Secondary | ICD-10-CM | POA: Diagnosis not present

## 2018-03-21 DIAGNOSIS — E039 Hypothyroidism, unspecified: Secondary | ICD-10-CM | POA: Insufficient documentation

## 2018-03-21 DIAGNOSIS — M5410 Radiculopathy, site unspecified: Secondary | ICD-10-CM | POA: Diagnosis present

## 2018-03-21 HISTORY — PX: ANTERIOR CERVICAL DECOMP/DISCECTOMY FUSION: SHX1161

## 2018-03-21 SURGERY — ANTERIOR CERVICAL DECOMPRESSION/DISCECTOMY FUSION 2 LEVELS
Anesthesia: General

## 2018-03-21 MED ORDER — BUPIVACAINE-EPINEPHRINE 0.25% -1:200000 IJ SOLN
INTRAMUSCULAR | Status: DC | PRN
  Administered 2018-03-21: 7 mL

## 2018-03-21 MED ORDER — LACTATED RINGERS IV SOLN
INTRAVENOUS | Status: DC
  Administered 2018-03-21 (×2): via INTRAVENOUS

## 2018-03-21 MED ORDER — PROPOFOL 10 MG/ML IV BOLUS
INTRAVENOUS | Status: AC
  Filled 2018-03-21: qty 20

## 2018-03-21 MED ORDER — SUCCINYLCHOLINE CHLORIDE 200 MG/10ML IV SOSY
PREFILLED_SYRINGE | INTRAVENOUS | Status: AC
  Filled 2018-03-21: qty 10

## 2018-03-21 MED ORDER — AMLODIPINE BESYLATE 5 MG PO TABS
5.0000 mg | ORAL_TABLET | Freq: Every day | ORAL | Status: DC
  Administered 2018-03-21: 5 mg via ORAL
  Filled 2018-03-21: qty 1

## 2018-03-21 MED ORDER — OXYCODONE-ACETAMINOPHEN 5-325 MG PO TABS
1.0000 | ORAL_TABLET | ORAL | Status: DC | PRN
  Administered 2018-03-21 – 2018-03-22 (×4): 2 via ORAL
  Filled 2018-03-21 (×3): qty 2

## 2018-03-21 MED ORDER — ROCURONIUM BROMIDE 10 MG/ML (PF) SYRINGE
PREFILLED_SYRINGE | INTRAVENOUS | Status: AC
  Filled 2018-03-21: qty 5

## 2018-03-21 MED ORDER — THROMBIN (RECOMBINANT) 20000 UNITS EX SOLR
CUTANEOUS | Status: DC | PRN
  Administered 2018-03-21: 20000 [IU] via TOPICAL

## 2018-03-21 MED ORDER — IRBESARTAN 300 MG PO TABS
300.0000 mg | ORAL_TABLET | Freq: Every day | ORAL | Status: DC
  Administered 2018-03-21 – 2018-03-22 (×2): 300 mg via ORAL
  Filled 2018-03-21 (×2): qty 1

## 2018-03-21 MED ORDER — BUPIVACAINE-EPINEPHRINE (PF) 0.25% -1:200000 IJ SOLN
INTRAMUSCULAR | Status: AC
  Filled 2018-03-21: qty 30

## 2018-03-21 MED ORDER — HYDROCHLOROTHIAZIDE 25 MG PO TABS
25.0000 mg | ORAL_TABLET | Freq: Every day | ORAL | Status: DC
  Administered 2018-03-21 – 2018-03-22 (×2): 25 mg via ORAL
  Filled 2018-03-21 (×2): qty 1

## 2018-03-21 MED ORDER — SENNOSIDES-DOCUSATE SODIUM 8.6-50 MG PO TABS: 1.0000 | ORAL_TABLET | Freq: Every evening | ORAL | Status: DC | PRN

## 2018-03-21 MED ORDER — SUCCINYLCHOLINE CHLORIDE 20 MG/ML IJ SOLN
INTRAMUSCULAR | Status: DC | PRN
  Administered 2018-03-21: 200 mg via INTRAVENOUS

## 2018-03-21 MED ORDER — FLEET ENEMA 7-19 GM/118ML RE ENEM: 1.0000 | ENEMA | Freq: Once | RECTAL | Status: DC | PRN

## 2018-03-21 MED ORDER — PREGABALIN 75 MG PO CAPS
75.0000 mg | ORAL_CAPSULE | Freq: Two times a day (BID) | ORAL | Status: DC
  Administered 2018-03-21 – 2018-03-22 (×2): 75 mg via ORAL
  Filled 2018-03-21 (×2): qty 1

## 2018-03-21 MED ORDER — PROMETHAZINE HCL 25 MG/ML IJ SOLN: 6.2500 mg | INTRAMUSCULAR | Status: DC | PRN

## 2018-03-21 MED ORDER — LEVOTHYROXINE SODIUM 100 MCG PO TABS
100.0000 ug | ORAL_TABLET | Freq: Every day | ORAL | Status: DC
  Administered 2018-03-22: 100 ug via ORAL
  Filled 2018-03-21: qty 1

## 2018-03-21 MED ORDER — ONDANSETRON HCL 4 MG/2ML IJ SOLN
INTRAMUSCULAR | Status: AC
  Filled 2018-03-21: qty 2

## 2018-03-21 MED ORDER — POVIDONE-IODINE 7.5 % EX SOLN: Freq: Once | CUTANEOUS | Status: DC

## 2018-03-21 MED ORDER — EPHEDRINE SULFATE-NACL 50-0.9 MG/10ML-% IV SOSY
PREFILLED_SYRINGE | INTRAVENOUS | Status: DC | PRN
  Administered 2018-03-21 (×2): 10 mg via INTRAVENOUS

## 2018-03-21 MED ORDER — SODIUM CHLORIDE 0.9% FLUSH: 3.0000 mL | Freq: Two times a day (BID) | INTRAVENOUS | Status: DC

## 2018-03-21 MED ORDER — ONDANSETRON HCL 4 MG/2ML IJ SOLN
4.0000 mg | Freq: Four times a day (QID) | INTRAMUSCULAR | Status: DC | PRN
  Administered 2018-03-21 (×2): 4 mg via INTRAVENOUS
  Filled 2018-03-21 (×2): qty 2

## 2018-03-21 MED ORDER — PANTOPRAZOLE SODIUM 40 MG PO TBEC
40.0000 mg | DELAYED_RELEASE_TABLET | Freq: Every day | ORAL | Status: DC
  Administered 2018-03-21 – 2018-03-22 (×2): 40 mg via ORAL
  Filled 2018-03-21 (×2): qty 1

## 2018-03-21 MED ORDER — B COMPLEX-C PO TABS
1.0000 | ORAL_TABLET | Freq: Every day | ORAL | Status: DC
  Administered 2018-03-22: 1 via ORAL
  Filled 2018-03-21 (×2): qty 1

## 2018-03-21 MED ORDER — CHOLESTYRAMINE 4 G PO PACK
4.0000 g | PACK | Freq: Every day | ORAL | Status: DC
Start: 2018-03-21 — End: 2018-03-22
  Filled 2018-03-21 (×2): qty 1

## 2018-03-21 MED ORDER — THROMBIN (RECOMBINANT) 20000 UNITS EX SOLR
CUTANEOUS | Status: AC
  Filled 2018-03-21: qty 20000

## 2018-03-21 MED ORDER — DIAZEPAM 5 MG PO TABS
5.0000 mg | ORAL_TABLET | Freq: Four times a day (QID) | ORAL | Status: DC | PRN
  Administered 2018-03-21 – 2018-03-22 (×2): 5 mg via ORAL
  Filled 2018-03-21 (×2): qty 1

## 2018-03-21 MED ORDER — SUGAMMADEX SODIUM 500 MG/5ML IV SOLN
INTRAVENOUS | Status: DC | PRN
  Administered 2018-03-21: 300 mg via INTRAVENOUS

## 2018-03-21 MED ORDER — B COMPLEX PO TABS: 1.0000 | ORAL_TABLET | Freq: Every day | ORAL | Status: DC

## 2018-03-21 MED ORDER — ONDANSETRON HCL 4 MG PO TABS
4.0000 mg | ORAL_TABLET | Freq: Four times a day (QID) | ORAL | Status: DC | PRN
  Administered 2018-03-22: 4 mg via ORAL
  Filled 2018-03-21: qty 1

## 2018-03-21 MED ORDER — ONDANSETRON HCL 4 MG/2ML IJ SOLN
INTRAMUSCULAR | Status: DC | PRN
  Administered 2018-03-21: 4 mg via INTRAVENOUS

## 2018-03-21 MED ORDER — SODIUM CHLORIDE 0.9 % IJ SOLN
INTRAMUSCULAR | Status: AC
  Filled 2018-03-21: qty 20

## 2018-03-21 MED ORDER — VORTIOXETINE HBR 5 MG PO TABS
5.0000 mg | ORAL_TABLET | Freq: Every day | ORAL | Status: DC
  Administered 2018-03-22: 5 mg via ORAL
  Filled 2018-03-21: qty 1
  Filled 2018-03-21: qty 0.5

## 2018-03-21 MED ORDER — LIDOCAINE HCL (CARDIAC) PF 100 MG/5ML IV SOSY
PREFILLED_SYRINGE | INTRAVENOUS | Status: DC | PRN
  Administered 2018-03-21: 40 mg via INTRAVENOUS
  Administered 2018-03-21: 60 mg via INTRAVENOUS

## 2018-03-21 MED ORDER — OLMESARTAN MEDOXOMIL-HCTZ 40-25 MG PO TABS: 1.0000 | ORAL_TABLET | Freq: Every day | ORAL | Status: DC

## 2018-03-21 MED ORDER — BISACODYL 5 MG PO TBEC: 5.0000 mg | DELAYED_RELEASE_TABLET | Freq: Every day | ORAL | Status: DC | PRN

## 2018-03-21 MED ORDER — PHENOL 1.4 % MT LIQD
1.0000 | OROMUCOSAL | Status: DC | PRN
  Administered 2018-03-22: 1 via OROMUCOSAL
  Filled 2018-03-21: qty 177

## 2018-03-21 MED ORDER — PHENYLEPHRINE 40 MCG/ML (10ML) SYRINGE FOR IV PUSH (FOR BLOOD PRESSURE SUPPORT)
PREFILLED_SYRINGE | INTRAVENOUS | Status: DC | PRN
  Administered 2018-03-21: 80 ug via INTRAVENOUS
  Administered 2018-03-21: 40 ug via INTRAVENOUS
  Administered 2018-03-21: 80 ug via INTRAVENOUS

## 2018-03-21 MED ORDER — MEPERIDINE HCL 50 MG/ML IJ SOLN: 6.2500 mg | INTRAMUSCULAR | Status: DC | PRN

## 2018-03-21 MED ORDER — OXYCODONE-ACETAMINOPHEN 5-325 MG PO TABS
ORAL_TABLET | ORAL | Status: AC
  Filled 2018-03-21: qty 2

## 2018-03-21 MED ORDER — GLUCOSAMINE-CHONDROITIN 250-200 MG PO TABS: ORAL_TABLET | Freq: Every day | ORAL | Status: DC

## 2018-03-21 MED ORDER — MIDAZOLAM HCL 5 MG/5ML IJ SOLN
INTRAMUSCULAR | Status: DC | PRN
  Administered 2018-03-21: 2 mg via INTRAVENOUS

## 2018-03-21 MED ORDER — LIDOCAINE 2% (20 MG/ML) 5 ML SYRINGE
INTRAMUSCULAR | Status: AC
  Filled 2018-03-21: qty 5

## 2018-03-21 MED ORDER — FENTANYL CITRATE (PF) 250 MCG/5ML IJ SOLN
INTRAMUSCULAR | Status: AC
  Filled 2018-03-21: qty 5

## 2018-03-21 MED ORDER — SUGAMMADEX SODIUM 500 MG/5ML IV SOLN
INTRAVENOUS | Status: AC
  Filled 2018-03-21: qty 5

## 2018-03-21 MED ORDER — DEXAMETHASONE SODIUM PHOSPHATE 10 MG/ML IJ SOLN
INTRAMUSCULAR | Status: DC | PRN
  Administered 2018-03-21: 4 mg via INTRAVENOUS

## 2018-03-21 MED ORDER — ALUM & MAG HYDROXIDE-SIMETH 200-200-20 MG/5ML PO SUSP
30.0000 mL | Freq: Every day | ORAL | Status: DC | PRN
  Administered 2018-03-22: 30 mL via ORAL
  Filled 2018-03-21: qty 30

## 2018-03-21 MED ORDER — ACETAMINOPHEN 325 MG PO TABS: 650.0000 mg | ORAL_TABLET | ORAL | Status: DC | PRN

## 2018-03-21 MED ORDER — MENTHOL 3 MG MT LOZG: 1.0000 | LOZENGE | OROMUCOSAL | Status: DC | PRN

## 2018-03-21 MED ORDER — LACTATED RINGERS IV SOLN: INTRAVENOUS | Status: DC

## 2018-03-21 MED ORDER — ATORVASTATIN CALCIUM 20 MG PO TABS
10.0000 mg | ORAL_TABLET | Freq: Every day | ORAL | Status: DC
  Administered 2018-03-21: 10 mg via ORAL
  Filled 2018-03-21: qty 1

## 2018-03-21 MED ORDER — VITAMIN C 500 MG PO TABS
1000.0000 mg | ORAL_TABLET | Freq: Every day | ORAL | Status: DC
  Administered 2018-03-22: 1000 mg via ORAL
  Filled 2018-03-21: qty 2

## 2018-03-21 MED ORDER — ROCURONIUM BROMIDE 100 MG/10ML IV SOLN
INTRAVENOUS | Status: DC | PRN
  Administered 2018-03-21: 60 mg via INTRAVENOUS
  Administered 2018-03-21: 30 mg via INTRAVENOUS
  Administered 2018-03-21: 10 mg via INTRAVENOUS

## 2018-03-21 MED ORDER — HYDROMORPHONE HCL 2 MG/ML IJ SOLN
0.2500 mg | INTRAMUSCULAR | Status: DC | PRN
  Administered 2018-03-21 (×2): 0.5 mg via INTRAVENOUS

## 2018-03-21 MED ORDER — DOCUSATE SODIUM 100 MG PO CAPS
100.0000 mg | ORAL_CAPSULE | Freq: Two times a day (BID) | ORAL | Status: DC
  Administered 2018-03-21 – 2018-03-22 (×2): 100 mg via ORAL
  Filled 2018-03-21 (×2): qty 1

## 2018-03-21 MED ORDER — EPHEDRINE SULFATE 50 MG/ML IJ SOLN
INTRAMUSCULAR | Status: AC
  Filled 2018-03-21: qty 3

## 2018-03-21 MED ORDER — PHENYLEPHRINE 40 MCG/ML (10ML) SYRINGE FOR IV PUSH (FOR BLOOD PRESSURE SUPPORT)
PREFILLED_SYRINGE | INTRAVENOUS | Status: AC
  Filled 2018-03-21: qty 10

## 2018-03-21 MED ORDER — LORATADINE 10 MG PO TABS
10.0000 mg | ORAL_TABLET | Freq: Every day | ORAL | Status: DC
  Administered 2018-03-22: 10 mg via ORAL
  Filled 2018-03-21: qty 1

## 2018-03-21 MED ORDER — ALPRAZOLAM 0.25 MG PO TABS: 0.2500 mg | ORAL_TABLET | Freq: Three times a day (TID) | ORAL | Status: DC | PRN

## 2018-03-21 MED ORDER — ACETAMINOPHEN 650 MG RE SUPP: 650.0000 mg | RECTAL | Status: DC | PRN

## 2018-03-21 MED ORDER — VANCOMYCIN HCL 10 G IV SOLR
1250.0000 mg | Freq: Once | INTRAVENOUS | Status: AC
  Administered 2018-03-21: 1250 mg via INTRAVENOUS
  Filled 2018-03-21: qty 1250

## 2018-03-21 MED ORDER — SODIUM CHLORIDE 0.9% FLUSH: 3.0000 mL | INTRAVENOUS | Status: DC | PRN

## 2018-03-21 MED ORDER — FENTANYL CITRATE (PF) 100 MCG/2ML IJ SOLN
INTRAMUSCULAR | Status: DC | PRN
  Administered 2018-03-21 (×4): 50 ug via INTRAVENOUS
  Administered 2018-03-21: 100 ug via INTRAVENOUS

## 2018-03-21 MED ORDER — SODIUM CHLORIDE 0.9 % IV SOLN: 250.0000 mL | INTRAVENOUS | Status: DC

## 2018-03-21 MED ORDER — 0.9 % SODIUM CHLORIDE (POUR BTL) OPTIME
TOPICAL | Status: DC | PRN
  Administered 2018-03-21 (×2): 1000 mL

## 2018-03-21 MED ORDER — MIDAZOLAM HCL 2 MG/2ML IJ SOLN
INTRAMUSCULAR | Status: AC
  Filled 2018-03-21: qty 2

## 2018-03-21 MED ORDER — ZOLPIDEM TARTRATE 5 MG PO TABS: 5.0000 mg | ORAL_TABLET | Freq: Every evening | ORAL | Status: DC | PRN

## 2018-03-21 MED ORDER — FUROSEMIDE 20 MG PO TABS: 20.0000 mg | ORAL_TABLET | Freq: Every day | ORAL | Status: DC | PRN

## 2018-03-21 MED ORDER — PROPOFOL 10 MG/ML IV BOLUS
INTRAVENOUS | Status: DC | PRN
  Administered 2018-03-21: 200 mg via INTRAVENOUS

## 2018-03-21 MED ORDER — FLUTICASONE PROPIONATE 50 MCG/ACT NA SUSP
1.0000 | Freq: Every day | NASAL | Status: DC
  Filled 2018-03-21: qty 16

## 2018-03-21 MED ORDER — HYDROMORPHONE HCL 2 MG/ML IJ SOLN
INTRAMUSCULAR | Status: AC
  Filled 2018-03-21: qty 1

## 2018-03-21 MED ORDER — ALUMINUM & MAGNESIUM HYDROXIDE 200-200 MG/5ML PO SUSP: 30.0000 mL | Freq: Every day | ORAL | Status: DC | PRN

## 2018-03-21 SURGICAL SUPPLY — 75 items
ATTRACTOMAT 16X20 MAGNETIC DRP (DRAPES) ×2 IMPLANT
BENZOIN TINCTURE PRP APPL 2/3 (GAUZE/BANDAGES/DRESSINGS) ×2 IMPLANT
BIT DRILL NEURO 2X3.1 SFT TUCH (MISCELLANEOUS) ×1 IMPLANT
BIT DRILL SRG 14X2.2XFLT CHK (BIT) ×1 IMPLANT
BIT DRL SRG 14X2.2XFLT CHK (BIT) ×1
BLADE CLIPPER SURG (BLADE) ×2 IMPLANT
BLADE SURG 15 STRL LF DISP TIS (BLADE) ×1 IMPLANT
BLADE SURG 15 STRL SS (BLADE) ×1
BONE VIVIGEN FORMABLE 1.3CC (Bone Implant) ×2 IMPLANT
BUR MATCHSTICK NEURO 3.0 LAGG (BURR) IMPLANT
CARTRIDGE OIL MAESTRO DRILL (MISCELLANEOUS) ×1 IMPLANT
COLLAR CERV LO CONTOUR FIRM DE (SOFTGOODS) IMPLANT
CORDS BIPOLAR (ELECTRODE) ×2 IMPLANT
COVER MAYO STAND STRL (DRAPES) ×2 IMPLANT
COVER SURGICAL LIGHT HANDLE (MISCELLANEOUS) ×2 IMPLANT
CRADLE DONUT ADULT HEAD (MISCELLANEOUS) ×2 IMPLANT
DECANTER SPIKE VIAL GLASS SM (MISCELLANEOUS) ×2 IMPLANT
DIFFUSER DRILL AIR PNEUMATIC (MISCELLANEOUS) ×2 IMPLANT
DRAIN JACKSON RD 7FR 3/32 (WOUND CARE) IMPLANT
DRAPE C-ARM 42X72 X-RAY (DRAPES) ×2 IMPLANT
DRAPE POUCH INSTRU U-SHP 10X18 (DRAPES) ×2 IMPLANT
DRAPE SURG 17X23 STRL (DRAPES) ×8 IMPLANT
DRILL BIT SKYLINE 14MM (BIT) ×1
DRILL NEURO 2X3.1 SOFT TOUCH (MISCELLANEOUS) ×2
DURAPREP 26ML APPLICATOR (WOUND CARE) ×2 IMPLANT
ELECT COATED BLADE 2.86 ST (ELECTRODE) ×2 IMPLANT
ELECT REM PT RETURN 9FT ADLT (ELECTROSURGICAL) ×2
ELECTRODE REM PT RTRN 9FT ADLT (ELECTROSURGICAL) ×1 IMPLANT
EVACUATOR SILICONE 100CC (DRAIN) IMPLANT
GAUZE SPONGE 4X4 12PLY STRL (GAUZE/BANDAGES/DRESSINGS) ×2 IMPLANT
GAUZE SPONGE 4X4 16PLY XRAY LF (GAUZE/BANDAGES/DRESSINGS) ×2 IMPLANT
GLOVE BIO SURGEON STRL SZ7 (GLOVE) ×2 IMPLANT
GLOVE BIO SURGEON STRL SZ8 (GLOVE) ×2 IMPLANT
GLOVE BIOGEL PI IND STRL 7.0 (GLOVE) ×2 IMPLANT
GLOVE BIOGEL PI IND STRL 8 (GLOVE) ×1 IMPLANT
GLOVE BIOGEL PI INDICATOR 7.0 (GLOVE) ×2
GLOVE BIOGEL PI INDICATOR 8 (GLOVE) ×1
GOWN STRL REUS W/ TWL LRG LVL3 (GOWN DISPOSABLE) ×1 IMPLANT
GOWN STRL REUS W/ TWL XL LVL3 (GOWN DISPOSABLE) ×1 IMPLANT
GOWN STRL REUS W/TWL LRG LVL3 (GOWN DISPOSABLE) ×1
GOWN STRL REUS W/TWL XL LVL3 (GOWN DISPOSABLE) ×1
INTERLOCK LRDTC CRVCL VBR 6MM (Bone Implant) ×2 IMPLANT
IV CATH 14GX2 1/4 (CATHETERS) ×2 IMPLANT
KIT BASIN OR (CUSTOM PROCEDURE TRAY) ×2 IMPLANT
KIT TURNOVER KIT B (KITS) ×2 IMPLANT
LORDOTIC CERVICAL VBR 6MM SM (Bone Implant) ×4 IMPLANT
MANIFOLD NEPTUNE II (INSTRUMENTS) ×2 IMPLANT
NEEDLE PRECISIONGLIDE 27X1.5 (NEEDLE) ×2 IMPLANT
NEEDLE SPNL 20GX3.5 QUINCKE YW (NEEDLE) ×2 IMPLANT
NS IRRIG 1000ML POUR BTL (IV SOLUTION) ×2 IMPLANT
OIL CARTRIDGE MAESTRO DRILL (MISCELLANEOUS) ×2
PACK ORTHO CERVICAL (CUSTOM PROCEDURE TRAY) ×2 IMPLANT
PAD ARMBOARD 7.5X6 YLW CONV (MISCELLANEOUS) ×4 IMPLANT
PATTIES SURGICAL .5 X.5 (GAUZE/BANDAGES/DRESSINGS) IMPLANT
PATTIES SURGICAL .5 X1 (DISPOSABLE) ×2 IMPLANT
PIN DISTRACTION 14 (PIN) ×4 IMPLANT
PLATE TWO LEVEL SKYLINE 30MM (Plate) ×2 IMPLANT
SCREW SKYLINE VAR OS 14MM (Screw) ×12 IMPLANT
SPONGE INTESTINAL PEANUT (DISPOSABLE) ×6 IMPLANT
SPONGE SURGIFOAM ABS GEL 100 (HEMOSTASIS) ×2 IMPLANT
STRIP CLOSURE SKIN 1/2X4 (GAUZE/BANDAGES/DRESSINGS) ×2 IMPLANT
SURGIFLO W/THROMBIN 8M KIT (HEMOSTASIS) IMPLANT
SUT MNCRL AB 4-0 PS2 18 (SUTURE) ×2 IMPLANT
SUT SILK 4 0 (SUTURE)
SUT SILK 4-0 18XBRD TIE 12 (SUTURE) IMPLANT
SUT VIC AB 2-0 CT2 18 VCP726D (SUTURE) ×2 IMPLANT
SYR BULB IRRIGATION 50ML (SYRINGE) ×2 IMPLANT
SYR CONTROL 10ML LL (SYRINGE) ×6 IMPLANT
TAPE CLOTH 4X10 WHT NS (GAUZE/BANDAGES/DRESSINGS) ×2 IMPLANT
TAPE CLOTH SURG 6X10 WHT LF (GAUZE/BANDAGES/DRESSINGS) ×2 IMPLANT
TAPE UMBILICAL COTTON 1/8X30 (MISCELLANEOUS) ×2 IMPLANT
TOWEL OR 17X24 6PK STRL BLUE (TOWEL DISPOSABLE) ×2 IMPLANT
TOWEL OR 17X26 10 PK STRL BLUE (TOWEL DISPOSABLE) ×2 IMPLANT
WATER STERILE IRR 1000ML POUR (IV SOLUTION) ×2 IMPLANT
YANKAUER SUCT BULB TIP NO VENT (SUCTIONS) ×2 IMPLANT

## 2018-03-21 NOTE — Anesthesia Preprocedure Evaluation (Signed)
Anesthesia Evaluation  Patient identified by MRN, date of birth, ID band Patient awake    Reviewed: Allergy & Precautions, NPO status , Patient's Chart, lab work & pertinent test results  Airway Mallampati: II  TM Distance: >3 FB Neck ROM: Full    Dental   Pulmonary sleep apnea ,    Pulmonary exam normal        Cardiovascular hypertension, Pt. on medications Normal cardiovascular exam     Neuro/Psych    GI/Hepatic GERD  Medicated and Controlled,  Endo/Other    Renal/GU      Musculoskeletal   Abdominal   Peds  Hematology   Anesthesia Other Findings   Reproductive/Obstetrics                             Anesthesia Physical Anesthesia Plan  ASA: III  Anesthesia Plan: General   Post-op Pain Management:    Induction: Intravenous  PONV Risk Score and Plan: 2 and Ondansetron, Dexamethasone and Midazolam  Airway Management Planned: Oral ETT  Additional Equipment:   Intra-op Plan:   Post-operative Plan: Extubation in OR  Informed Consent: I have reviewed the patients History and Physical, chart, labs and discussed the procedure including the risks, benefits and alternatives for the proposed anesthesia with the patient or authorized representative who has indicated his/her understanding and acceptance.     Plan Discussed with: CRNA and Surgeon  Anesthesia Plan Comments:         Anesthesia Quick Evaluation

## 2018-03-21 NOTE — Anesthesia Procedure Notes (Addendum)
Procedure Name: Intubation Date/Time: 03/21/2018 11:18 AM Performed by: Scheryl Darter, CRNA Pre-anesthesia Checklist: Patient identified, Emergency Drugs available, Suction available and Patient being monitored Patient Re-evaluated:Patient Re-evaluated prior to induction Oxygen Delivery Method: Circle System Utilized Preoxygenation: Pre-oxygenation with 100% oxygen Induction Type: IV induction Ventilation: Mask ventilation without difficulty Laryngoscope Size: Glidescope and 4 Grade View: Grade IV Tube type: Oral Tube size: 7.5 mm Number of attempts: 2 Airway Equipment and Method: Stylet,  Oral airway,  Rigid stylet and Bougie stylet Placement Confirmation: ETT inserted through vocal cords under direct vision,  positive ETCO2 and breath sounds checked- equal and bilateral Secured at: 23 cm Tube secured with: Tape Dental Injury: Teeth and Oropharynx as per pre-operative assessment  Difficulty Due To: Difficulty was anticipated, Difficult Airway- due to large tongue, Difficult Airway- due to reduced neck mobility and Difficult Airway- due to anterior larynx Comments: DL x 1,with glide scope/ could not advance tube/cuff thru cords/Blue bougie attempted thru tube/tube dislodged and was removed. DL x 1 again with glide scope, successful placement/cuff up/BBSEC

## 2018-03-21 NOTE — H&P (Signed)
PREOPERATIVE H&P  Chief Complaint: Left arm pain and numbness  HPI: TAE Brendan Yang is a 62 y.o. male who presents with ongoing pain in the left arm, as well as left arm numbness  MRI reveals NF stenosis on the left at C5/6 and C6/7  Patient has failed multiple forms of conservative care and continues to have pain (see office notes for additional details regarding the patient's full course of treatment)  Past Medical History:  Diagnosis Date  . Arthritis   . Cancer ([place])    PROSTATE  . Dysrhythmia    PALPITATION  . GERD (gastroesophageal reflux disease)   . History of kidney stones   . Hypertension   . Hypothyroidism   . Sleep apnea    CPAP      Past Surgical History:  Procedure Laterality Date  . APPENDECTOMY    . CARDIAC CATHETERIZATION     08/31/17 [facility] (Dr. Mayra Neer): Normal coronaries, false positive Cardiolite. (EF 56% by 07/2017 Cardiolilte)  . CHOLECYSTECTOMY    . KNEE CARTILAGE SURGERY     RT KNEE '16  . PROSTATE SURGERY      HIGH INTENSITY US  DONE IN [place] '09   Social History   Socioeconomic History  . Marital status: Married    Spouse name: Not on file  . Number of children: Not on file  . Years of education: Not on file  . Highest education level: Not on file  Occupational History  . Not on file  Social Needs  . Financial resource strain: Not on file  . Food insecurity:    Worry: Not on file    Inability: Not on file  . Transportation needs:    Medical: Not on file    Non-medical: Not on file  Tobacco Use  . Smoking status: Never Smoker  . Smokeless tobacco: Never Used  Substance and Sexual Activity  . Alcohol use: Yes    Comment: WINE  . Drug use: No  . Sexual activity: Not on file  Lifestyle  . Physical activity:    Days per week: Not on file    Minutes per session: Not on file  . Stress: Not on file  Relationships  . Social connections:    Talks on phone: Not on file    Gets together: Not on file    Attends religious  service: Not on file    Active member of club or organization: Not on file    Attends meetings of clubs or organizations: Not on file    Relationship status: Not on file  Other Topics Concern  . Not on file  Social History Narrative  . Not on file   No family history on file. Allergies  Allergen Reactions  . Droperidol Anaphylaxis and Other (See Comments)    Inapsin Had to call Code Blue Pt arrested  . Gabapentin     Weird dreams  . Penicillins Rash    Has patient had a PCN reaction causing immediate rash, facial/tongue/throat swelling, SOB or lightheadedness with hypotension: No Has patient had a PCN reaction causing severe rash involving mucus membranes or skin necrosis: No Has patient had a PCN reaction that required hospitalization: No Has patient had a PCN reaction occurring within the last 10 years: No If all of the above answers are "NO", then may proceed with Cephalosporin use.   . Streptomycin Rash  . Sulfa Antibiotics Rash   Prior to Admission medications   Medication Sig Start Date  End Date Taking? Authorizing Provider  acetaminophen-codeine (TYLENOL #3) 300-30 MG tablet Take 1 tablet by mouth at bedtime as needed for moderate pain. Patient taking differently. He reported it was prescribed as 1 tab Q 6-8 hours as needed for pain. He is taking 1/2 tablet Q 6 hours as needed for pain. (03/18/18)   Yes [provider]  ALPRAZolam (XANAX) 0.25 MG tablet Take 0.25 mg by mouth 3 (three) times daily as needed for anxiety.   Yes [provider]  aluminum-magnesium hydroxide 200-200 MG/5ML suspension Take 30 mLs by mouth daily as needed for indigestion.    Yes [provider]  amLODipine (NORVASC) 5 MG tablet Take 5 mg by mouth at bedtime.   Yes [provider]  Ascorbic Acid (VITAMIN C) 1000 MG tablet Take 1,000 mg by mouth daily.   Yes [provider]  ASPERCREME LIDOCAINE EX Apply 1 application topically daily as needed (joint pain).    Yes [provider]  aspirin EC 81 MG tablet Take 81 mg by mouth daily.   Yes [provider]  atorvastatin (LIPITOR) 10 MG tablet Take 10 mg by mouth at bedtime.   Yes [provider]  b complex vitamins tablet Take 1 tablet by mouth daily.   Yes [provider]  cholestyramine Lucrezia Starch) 4 GM/DOSE powder Take 4 g by mouth daily.   Yes [provider]  cyclobenzaprine (FLEXERIL) 5 MG tablet Take 5 mg by mouth 2 (two) times daily as needed for muscle spasms.    Yes [provider]  fexofenadine (ALLEGRA) 180 MG tablet Take 180 mg by mouth daily as needed for allergies or rhinitis.   Yes [provider]  furosemide (LASIX) 20 MG tablet Take 20 mg by mouth daily as needed for edema.    Yes [provider]  Glucosamine-Chondroitin (OSTEO BI-FLEX REGULAR STRENGTH PO) Take 1 tablet by mouth daily.   Yes [provider]  Ibuprofen-Famotidine (DUEXIS) 800-26.6 MG TABS Take 1 tablet by mouth 2 (two) times daily as needed (for pain.).   Yes [provider]  Javier Docker Oil 350 MG CAPS Take 350 mg by mouth daily.   Yes [provider]  levothyroxine (SYNTHROID, LEVOTHROID) 100 MCG tablet Take 100 mcg by mouth daily before breakfast.   Yes [provider]  Misc Natural Products (TURMERIC CURCUMIN) CAPS Take 1 capsule by mouth daily.   Yes [provider]  naproxen (NAPROSYN) 500 MG tablet Take 500 mg by mouth 2 (two) times daily as needed (for pain.).   Yes [provider]  olmesartan-hydrochlorothiazide (BENICAR HCT) 40-25 MG tablet Take 1 tablet by mouth daily.   Yes [provider]  predniSONE (DELTASONE) 10 MG tablet Take 10 mg by mouth 4 (four) times daily. He reported that he is taking for back and knee pain. He reported taking differently, he is taking 10 mg 2 tabs by mouth Q HS. (03/18/18)   Yes [provider]  pregabalin (LYRICA) 75 MG capsule Take 75 mg by mouth 2  (two) times daily.   Yes [provider]  RABEprazole (ACIPHEX) 20 MG tablet Take 20 mg by mouth 2 (two) times daily.   Yes [provider]  traMADol (ULTRAM) 50 MG tablet Take 50 mg by mouth every 8 (eight) hours as needed for moderate pain.   Yes [provider]  Triamcinolone Acetonide (NASACORT AQ NA) Place 1 spray into the nose daily as needed (allergies).   Yes [provider]  vortioxetine HBr (TRINTELLIX) 5 MG TABS tablet Take 5 mg by mouth daily.   Yes [provider]  Verlee Monte (PREPARATION H EX) Apply 1 application topically 2 (two) times daily.   Yes [provider]     All other systems have been reviewed and were otherwise negative with the exception of those mentioned in the HPI and as above.  Physical Exam: There were no vitals filed for this visit.  There is no height or weight on file to calculate BMI.  General: Alert, no acute distress Cardiovascular: No pedal edema Respiratory: No cyanosis, no use of accessory musculature Skin: No lesions in the area of chief complaint Neurologic: Sensation intact distally Psychiatric: Patient is competent for consent with normal mood and affect Lymphatic: No axillary or cervical lymphadenopathy  MUSCULOSKELETAL: + spurling sign on the left  Assessment/Plan: LEFT ARM PAIN AND NUMBNESS Plan for Procedure(s): ANTERIOR CERVICAL DECOMPRESSION FUSION, CERVICAL 5-6, CERVICAL 6-7 WITH INSTRUMENTATION AND ALLOGRAFT  Sinclair Ship, MD 03/21/2018 6:45 AM

## 2018-03-21 NOTE — Progress Notes (Signed)
Pharmacy Antibiotic Note  Brendan Yang is a 62 y.o. male admitted on 03/21/2018 with surgical prophylaxis.  Pharmacy has been consulted for vancomycin dosing. No surgical drain in place.   Plan: -Vancomycin 1250 mg x 1 dose 12 hours post previous vancomycin dose.  -Pharmacy to sign off   Height: 5\' 7"  (170.2 cm) Weight: 289 lb 4.8 oz (131.2 kg) IBW/kg (Calculated) : 66.1  Temp (24hrs), Avg:98.2 F (36.8 C), Min:97.7 F (36.5 C), Max:99.3 F (37.4 C)  Recent Labs  Lab 03/15/18 0843  WBC 12.3*  CREATININE 0.91    Estimated Creatinine Clearance: 109.6 mL/min (by C-G formula based on SCr of 0.91 mg/dL).    Allergies  Allergen Reactions  . Droperidol Anaphylaxis and Other (See Comments)    Inapsin Had to call Code Blue Pt arrested  . Gabapentin     Weird dreams  . Penicillins Rash    Has patient had a PCN reaction causing immediate rash, facial/tongue/throat swelling, SOB or lightheadedness with hypotension: No Has patient had a PCN reaction causing severe rash involving mucus membranes or skin necrosis: No Has patient had a PCN reaction that required hospitalization: No Has patient had a PCN reaction occurring within the last 10 years: No If all of the above answers are "NO", then may proceed with Cephalosporin use.   . Streptomycin Rash  . Sulfa Antibiotics Rash      Thank you for allowing pharmacy to be a part of this patient's care.  Albertina Parr, PharmD., BCPS Clinical Pharmacist Clinical phone for 03/21/18 until 11pm: [phone] If after 11pm, please call main pharmacy at: [phone]

## 2018-03-21 NOTE — Anesthesia Postprocedure Evaluation (Signed)
Anesthesia Post Note  Patient: Brendan Yang  Procedure(s) Performed: ANTERIOR CERVICAL DECOMPRESSION FUSION, CERVICAL 5-6, CERVICAL 6-7 WITH INSTRUMENTATION AND ALLOGRAFT  TIME REQUESTED 3.5 HOURS (N/A )     Patient location during evaluation: PACU Anesthesia Type: General Level of consciousness: awake and alert Pain management: pain level controlled Vital Signs Assessment: post-procedure vital signs reviewed and stable Respiratory status: spontaneous breathing, nonlabored ventilation, respiratory function stable and patient connected to nasal cannula oxygen Cardiovascular status: blood pressure returned to baseline and stable Postop Assessment: no apparent nausea or vomiting Anesthetic complications: no    Last Vitals:  Vitals:   03/21/18 1600 03/21/18 1625  BP: 116/73 139/90  Pulse: 82 82  Resp: 12 18  Temp: 36.6 C 36.5 C  SpO2: 96% 94%    Last Pain:  Vitals:   03/21/18 1625  TempSrc: Oral  PainSc:                  [name]

## 2018-03-21 NOTE — Transfer of Care (Signed)
Immediate Anesthesia Transfer of Care Note  Patient: Brendan Yang  Procedure(s) Performed: ANTERIOR CERVICAL DECOMPRESSION FUSION, CERVICAL 5-6, CERVICAL 6-7 WITH INSTRUMENTATION AND ALLOGRAFT  TIME REQUESTED 3.5 HOURS (N/A )  Patient Location: PACU  Anesthesia Type:General  Level of Consciousness: awake, alert  and oriented  Airway & Oxygen Therapy: Patient Spontanous Breathing  Post-op Assessment: Report given to RN, Post -op Vital signs reviewed and stable and Patient moving all extremities X 4  Post vital signs: Reviewed and stable  Last Vitals:  Vitals Value Taken Time  BP 128/78 03/21/2018  2:58 PM  Temp    Pulse 91 03/21/2018  3:00 PM  Resp 21 03/21/2018  3:00 PM  SpO2 95 % 03/21/2018  3:00 PM  Vitals shown include unvalidated device data.  Last Pain:  Vitals:   03/21/18 1008  TempSrc: Oral  PainSc: 4       Patients Stated Pain Goal: 3 (34/91/79 1505)  Complications: No apparent anesthesia complications

## 2018-03-21 NOTE — Op Note (Signed)
NAME:   Brendan Yang            MEDICAL RECORD NO.: [id number]   PHYSICIAN:  Phylliss Bob, MD      DATE OF BIRTH:   1955/12/26   DATE OF PROCEDURE:   03/21/2018                               OPERATIVE REPORT     PREOPERATIVE DIAGNOSES: 1. Left-sided cervical radiculopathy. 2. Spinal stenosis spanning C5-C7. 3. Spinal cord compression C5-6   POSTOPERATIVE DIAGNOSES: 1. Left-sided cervical radiculopathy. 2. Spinal stenosis spanning C5-C7. 3. Spinal cord compression C5-6   PROCEDURE: 1. Anterior cervical decompression and fusion C5-6, C6-7. 2. Placement of anterior instrumentation, C5-C7. 3. Insertion of interbody device x2 (62mm Titan intervertebral spacers). 4. Intraoperative use of fluoroscopy. 5. Use of morselized allograft - ViviGen.   SURGEON:  Phylliss Bob, MD   ASSISTANT:  Pricilla Holm, PA-C.   ANESTHESIA:  General endotracheal anesthesia.   COMPLICATIONS:  None.   DISPOSITION:  Stable.   ESTIMATED BLOOD LOSS:  Minimal.   INDICATIONS FOR SURGERY:  Briefly, Mr. Shear is a pleasant 62 year old male, who did present to me with pain in his neck and right arm, as well as numbness in the left hand.   The patient's MRI did reveal the findings noted above.  Given the patient's ongoing rather debilitating pain and lack of improvement with appropriate treatment measures, we did discuss proceeding with the procedure noted above.  The patient was fully aware of the risks and limitations of surgery as outlined in my preoperative note, and he did wish to proceed with surgery.   OPERATIVE DETAILS:  On 03/21/2018, the patient was brought to surgery and general endotracheal anesthesia was administered.  The patient was placed supine on the hospital bed. The neck was gently extended.  All bony prominences were meticulously padded.  The neck was prepped and draped in the usual sterile fashion.  At this point, I did make a left-sided transverse incision.  The platysma was  incised.  A Smith-Robinson approach was used and the anterior spine was identified. A self-retaining retractor was placed.  I then subperiosteally exposed the vertebral bodies from C5-C7.   Of note, given the patient's thick neck and shoulders, exposing the C6-7 intervertebral disc was rather meticulous, however, it was ultimately safely exposed. Caspar pins were then placed into the C6 and C7 vertebral bodies and distraction was applied.  A thorough and complete C6-7 intervertebral diskectomy was performed.  The spinal canal was thoroughly decompressed, as was the right and left neuroforamen.  The endplates were then prepared and the appropriate-sized intervertebral spacer was then packed with ViviGen and tamped into position in the usual fashion.  The lower Caspar pin was then removed and placed into the C5 vertebral body and once again, distraction was applied across the C5-6 intervertebral space.  I then again performed a thorough and complete diskectomy, thoroughly decompressing the spinal canal and right and left neuroforamen.    Well performing the discectomy, the surgical technician noted that the #1 Kerrison was broken.  Specifically, the tip of the instrument was broken off of the body of the instrument.  The surgical team did explore the room and suction canisters, but were not able to find the tip of the instrument.  I did thoroughly explore the wound in the region where I was working, including the region of the decompression, and  it was again not visualized.  We then obtained multiple AP and lateral fluoroscopic images, and it was clear that the tip of the instrument was not retained anywhere in the region of the patient's surgery.  I did call and talk to the radiologist, who did also feel that the instrument was not retained.  I then proceeded with the remainder of the procedure. After preparing the endplates, the appropriate-sized intervertebral spacer was packed with ViviGen and  tamped into position.  The Caspar pins then were removed and bone wax was placed in their place.  The appropriate-sized anterior cervical plate was placed over the anterior spine.  14 mm variable angle screws were placed, 2 in each vertebral body from C5-C7 for a total of 6 vertebral body screws.  The screws were then locked to the plate using the Cam locking mechanism.  I was very pleased with the final fluoroscopic images.  The wound was then irrigated.  The wound was then explored for any undue bleeding and there was no bleeding noted. The wound was then closed in layers using 2-0 Vicryl, followed by 4-0 Monocryl.  Benzoin and Steri-Strips were applied, followed by sterile dressing.  All instrument counts were correct at the termination of the procedure.   Of note, Pricilla Holm, PA-C, was my assistant throughout surgery, and did aid in retraction, suctioning, and closure from start to finish.         Phylliss Bob, MD

## 2018-03-22 ENCOUNTER — Encounter (HOSPITAL_COMMUNITY): Payer: Self-pay | Admitting: Orthopedic Surgery

## 2018-03-22 DIAGNOSIS — M4802 Spinal stenosis, cervical region: Secondary | ICD-10-CM | POA: Diagnosis not present

## 2018-03-22 MED ORDER — OXYCODONE-ACETAMINOPHEN 5-325 MG PO TABS: 1.0000 | ORAL_TABLET | ORAL | 0 refills | Status: AC | PRN

## 2018-03-22 MED ORDER — DIAZEPAM 5 MG PO TABS: 5.0000 mg | ORAL_TABLET | Freq: Four times a day (QID) | ORAL | 0 refills | Status: AC | PRN

## 2018-03-22 NOTE — Progress Notes (Signed)
    Patient doing well  Patient denies left arm pain Has been tolerating PO   Physical Exam: Vitals:   03/22/18 0353 03/22/18 0735  BP: 122/89 132/84  Pulse: 67 70  Resp: 16 18  Temp: 97.6 F (36.4 C) 97.8 F (36.6 C)  SpO2: 97% 94%   Neck soft/supple Dressing in place NVI  POD #1 s/p C5-C7 ACDF, doing well  - encourage ambulation - Percocet for pain, Valium for muscle spasms - d/c home today with f/u in 2 weeks

## 2018-03-22 NOTE — Progress Notes (Signed)
Patient alert and oriented, mae's well, voiding adequate amount of urine, swallowing without difficulty, no c/o pain at time of discharge. Patient discharged home with family. Script and discharged instructions given to patient. Patient and family stated understanding of instructions given. Patient has an appointment with Dr. Dumonski 

## 2018-03-28 NOTE — Discharge Summary (Signed)
Patient ID: Brendan Yang MRN: [id number] DOB/AGE: 1956-02-04 62 y.o.  Admit date: 03/21/2018 Discharge date: 03/22/2018  Admission Diagnoses:  Active Problems:   Radiculopathy   Discharge Diagnoses:  Same  Past Medical History:  Diagnosis Date  . Arthritis   . Cancer ([place])    PROSTATE  . Dysrhythmia    PALPITATION  . GERD (gastroesophageal reflux disease)   . History of kidney stones   . Hypertension   . Hypothyroidism   . Sleep apnea    CPAP       Surgeries: Procedure(s): ANTERIOR CERVICAL DECOMPRESSION FUSION, CERVICAL 5-6, CERVICAL 6-7 WITH INSTRUMENTATION AND ALLOGRAFT  TIME REQUESTED 3.5 HOURS on 03/21/2018   Consultants: None  Discharged Condition: Improved  Hospital Course: Brendan Yang is an 62 y.o. male who was admitted 03/21/2018 for operative treatment of radiculopathy. Patient has severe unremitting pain that affects sleep, daily activities, and work/hobbies. After pre-op clearance the patient was taken to the operating room on 03/21/2018 and underwent  Procedure(s): ANTERIOR CERVICAL DECOMPRESSION FUSION, CERVICAL 5-6, CERVICAL 6-7 WITH INSTRUMENTATION AND ALLOGRAFT  TIME REQUESTED 3.5 HOURS.    Patient was given perioperative antibiotics:  Anti-infectives (From admission, onward)   Start     Dose/Rate Route Frequency Ordered Stop   03/21/18 2200  vancomycin (VANCOCIN) 1,250 mg in sodium chloride 0.9 % 250 mL IVPB     1,250 mg 166.7 mL/hr over 90 Minutes Intravenous  Once 03/21/18 1701 03/21/18 2241   03/21/18 0600  vancomycin (VANCOCIN) 1,500 mg in sodium chloride 0.9 % 500 mL IVPB     1,500 mg 250 mL/hr over 120 Minutes Intravenous On call to O.R. 03/20/18 1343 03/21/18 1204       Patient was given sequential compression devices, early ambulation to prevent DVT.  Patient benefited maximally from hospital stay and there were no complications.    Recent vital signs: BP 132/84 (BP Location: Right Arm)   Pulse 70   Temp 97.8 F (36.6 C)  (Oral)   Resp 18   Ht 5\' 7"  (1.702 m)   Wt 131.2 kg (289 lb 4.8 oz)   SpO2 94%   BMI 45.31 kg/m    Discharge Medications:   Allergies as of 03/22/2018      Reactions   Droperidol Anaphylaxis, Other (See Comments)   Inapsin Had to call [employer] Pt arrested   Gabapentin    Weird dreams   Penicillins Rash   Has patient had a PCN reaction causing immediate rash, facial/tongue/throat swelling, SOB or lightheadedness with hypotension: No Has patient had a PCN reaction causing severe rash involving mucus membranes or skin necrosis: No Has patient had a PCN reaction that required hospitalization: No Has patient had a PCN reaction occurring within the last 10 years: No If all of the above answers are "NO", then may proceed with Cephalosporin use.   Streptomycin Rash   Sulfa Antibiotics Rash      Medication List    STOP taking these medications   acetaminophen-codeine 300-30 MG tablet Commonly known as:  TYLENOL #3   predniSONE 10 MG tablet Commonly known as:  DELTASONE     TAKE these medications   ALPRAZolam 0.25 MG tablet Commonly known as:  XANAX Take 0.25 mg by mouth 3 (three) times daily as needed for anxiety.   aluminum-magnesium hydroxide 200-200 MG/5ML suspension Take 30 mLs by mouth daily as needed for indigestion.   amLODipine 5 MG tablet Commonly known as:  NORVASC Take 5 mg by mouth  at bedtime.   ASPERCREME LIDOCAINE EX Apply 1 application topically daily as needed (joint pain).   aspirin EC 81 MG tablet Take 81 mg by mouth daily.   atorvastatin 10 MG tablet Commonly known as:  LIPITOR Take 10 mg by mouth at bedtime.   b complex vitamins tablet Take 1 tablet by mouth daily.   cholestyramine 4 GM/DOSE powder Commonly known as:  QUESTRAN Take 4 g by mouth daily.   diazepam 5 MG tablet Commonly known as:  VALIUM Take 1 tablet (5 mg total) by mouth every 6 (six) hours as needed for muscle spasms.   fexofenadine 180 MG tablet Commonly known as:   ALLEGRA Take 180 mg by mouth daily as needed for allergies or rhinitis.   furosemide 20 MG tablet Commonly known as:  LASIX Take 20 mg by mouth daily as needed for edema.   levothyroxine 100 MCG tablet Commonly known as:  SYNTHROID, LEVOTHROID Take 100 mcg by mouth daily before breakfast.   NASACORT AQ NA Place 1 spray into the nose daily as needed (allergies).   olmesartan-hydrochlorothiazide 40-25 MG tablet Commonly known as:  BENICAR HCT Take 1 tablet by mouth daily.   OSTEO BI-FLEX REGULAR STRENGTH PO Take 1 tablet by mouth daily.   oxyCODONE-acetaminophen 5-325 MG tablet Commonly known as:  PERCOCET/ROXICET Take 1-2 tablets by mouth every 4 (four) hours as needed for moderate pain or severe pain.   pregabalin 75 MG capsule Commonly known as:  LYRICA Take 75 mg by mouth 2 (two) times daily.   PREPARATION H EX Apply 1 application topically 2 (two) times daily.   RABEprazole 20 MG tablet Commonly known as:  ACIPHEX Take 20 mg by mouth 2 (two) times daily.   TRINTELLIX 5 MG Tabs tablet Generic drug:  vortioxetine HBr Take 5 mg by mouth daily.   Turmeric Curcumin Caps Take 1 capsule by mouth daily.   vitamin C 1000 MG tablet Take 1,000 mg by mouth daily.       Diagnostic Studies: Dg Cervical Spine 2-3 Views  Result Date: 03/21/2018 CLINICAL DATA:  Cervical spine surgery. EXAM: DG C-ARM 61-120 MIN; CERVICAL SPINE - 2-3 VIEW COMPARISON:  03/21/2018. FINDINGS: Prior lower cervical anterior interbody fusion. Hardware intact anatomic. Surgical sponges noted over the anterior neck. Endotracheal tube noted. IMPRESSION: Prior lower anterior and interbody cervical spine fusion. Hardware intact. Anatomic alignment. Electronically Signed   By: Marcello Moores  Register   On: 03/21/2018 14:53   Dg Cervical Spine 2-3 Views  Result Date: 03/21/2018 CLINICAL DATA:  Foreign body and soft tissue. EXAM: CERVICAL SPINE - 2-3 VIEW COMPARISON:  Radiographs of February 15, 2017. FINDINGS: Three  intraoperative images of the lower cervical spine were obtained. The patient appears to be undergoing surgical anterior fusion of lower cervical disc space interbody fusion. Screws are seen directed into 2 more superior vertebral bodies. Surgical retractor is seen on anterior projection. On the lateral projections, surgical instrument is seen overlying the screws which was deliberately placed in image by surgeon to allow comparison. No other radiopaque foreign body is noted. IMPRESSION: Two surgical screws are seen as well as stabilization cage in lower cervical spine, as well as a surgical instrument seen on the 2 lateral projections which was deliberately placed by surgeon to allow comparison. No other definite radiopaque foreign body is noted. These results were called by telephone at the time of interpretation on 03/21/2018 at 2:31 pm to Dr. Phylliss Bob , who verbally acknowledged these results. Electronically Signed  By: Marijo Conception, M.D.   On: 03/21/2018 14:32   Dg C-arm 1-60 Min  Result Date: 03/21/2018 CLINICAL DATA:  Cervical spine surgery. EXAM: DG C-ARM 61-120 MIN; CERVICAL SPINE - 2-3 VIEW COMPARISON:  03/21/2018. FINDINGS: Prior lower cervical anterior interbody fusion. Hardware intact anatomic. Surgical sponges noted over the anterior neck. Endotracheal tube noted. IMPRESSION: Prior lower anterior and interbody cervical spine fusion. Hardware intact. Anatomic alignment. Electronically Signed   By: [place]   On: 03/21/2018 14:53    Disposition:    POD #1 s/p C5-C7 ACDF, doing well  - encourage ambulation - Percocet for pain, Valium for muscle spasms -Written scripts for pain signed and in chart -D/C instructions sheet printed and in chart -D/C today  -F/U in office 2 weeks   Signed: Justice Britain 03/28/2018, 11:18 AM

## 2019-06-30 IMAGING — RF DG CERVICAL SPINE 2 OR 3 VIEWS
1 series · 3 of 3 positions shown · non-contrast
Comparison: Radiographs February 15, 2017.

CLINICAL DATA: Foreign body and soft tissue.

EXAM:
CERVICAL SPINE - 2-3 VIEW

[Series 1: run · 3 of 3 slices shown]
[im 1/3]
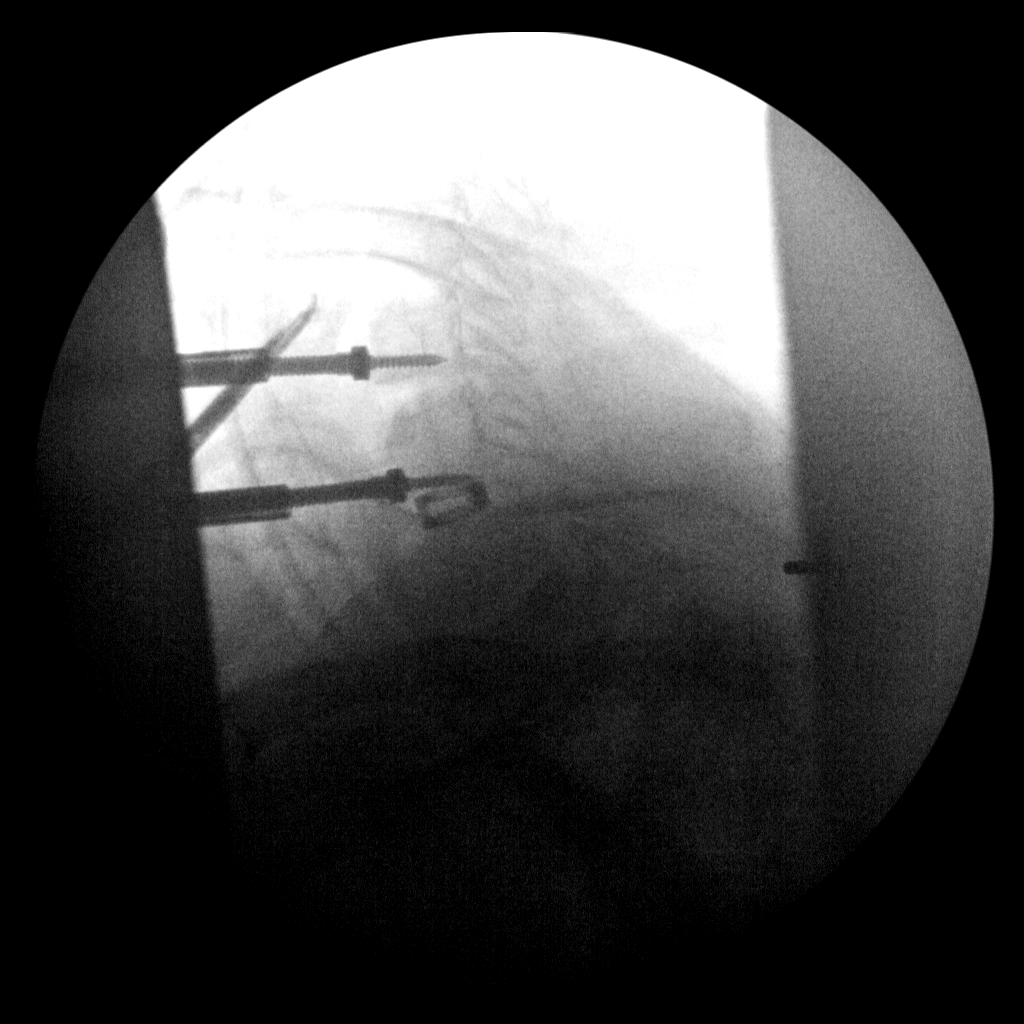
[im 2/3]
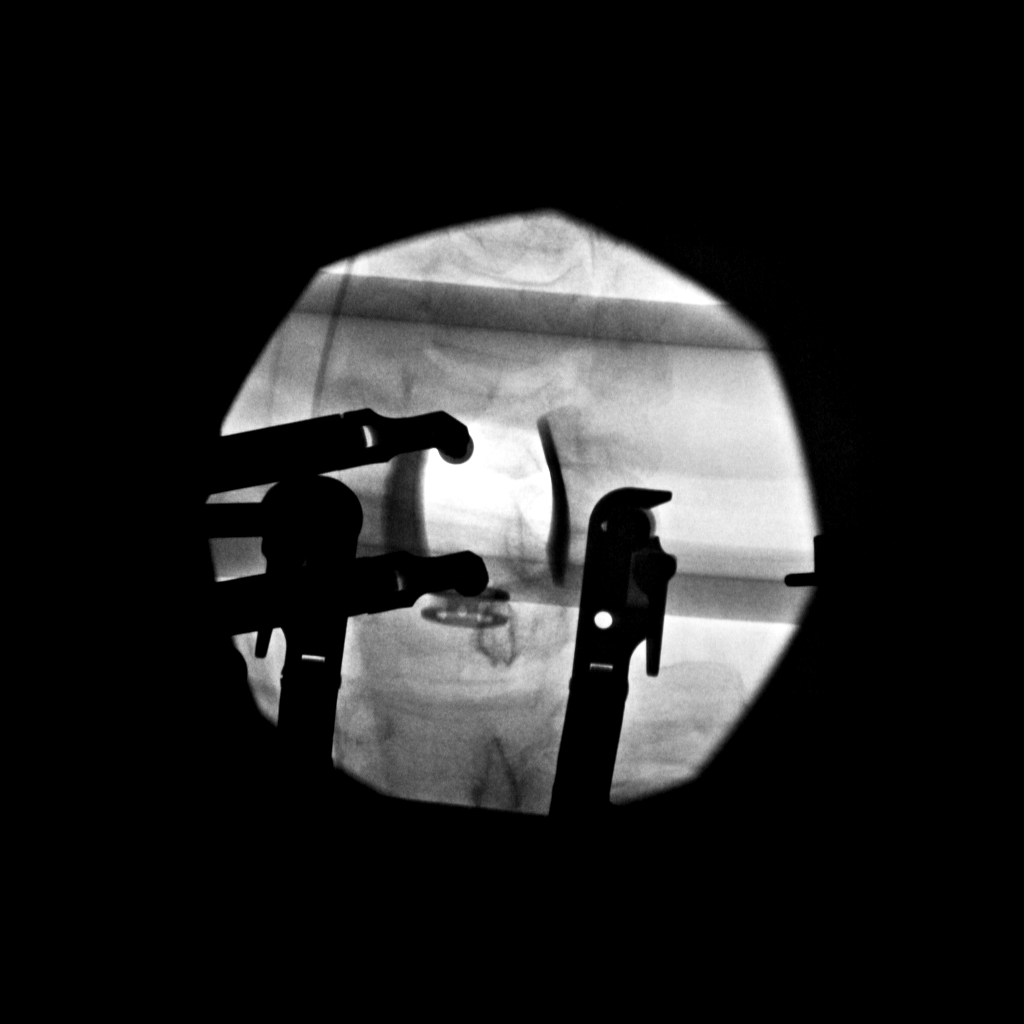
[im 3/3]
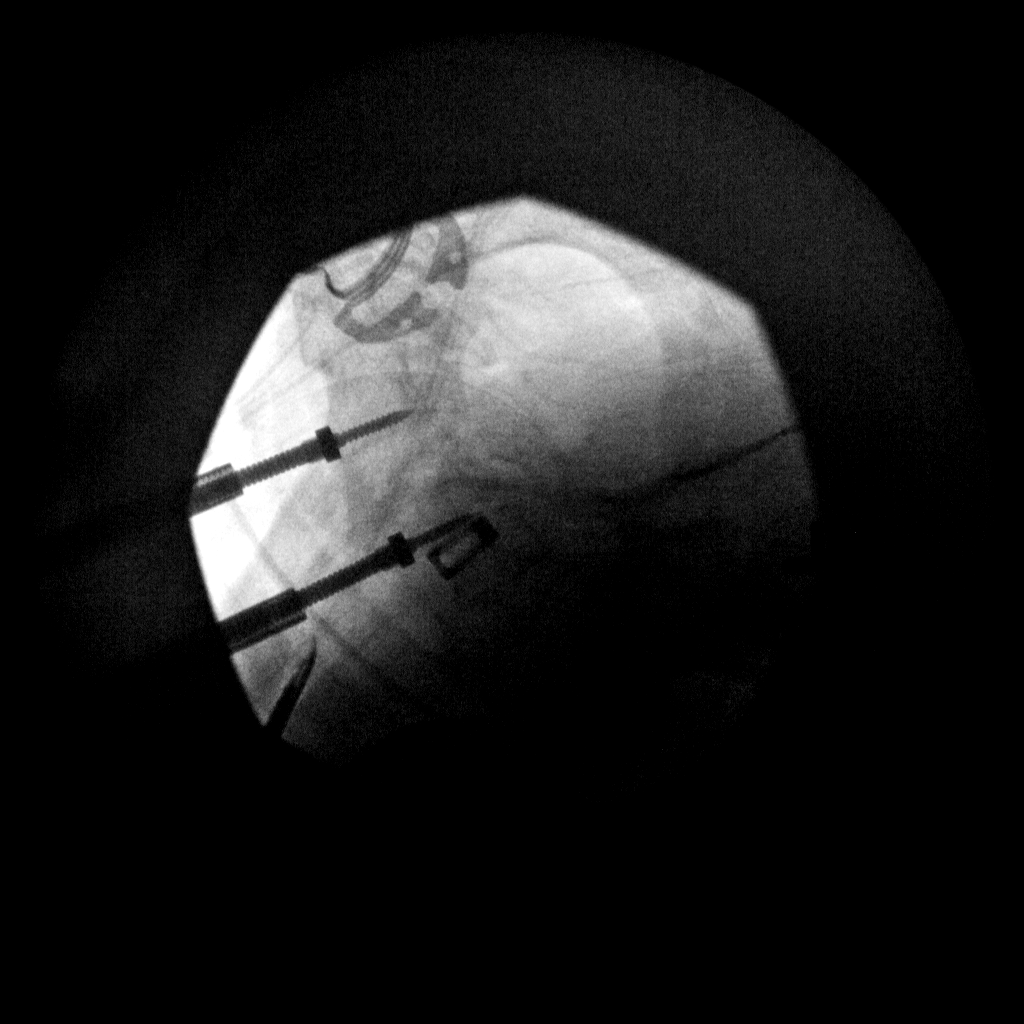

[3 of 3 positions shown; findings below may reference images not displayed]

FINDINGS: Three intraoperative images of the lower cervical spine were
obtained. The patient appears to be undergoing surgical anterior
fusion of lower cervical disc space interbody fusion. Screws are
seen directed into 2 more superior vertebral bodies. Surgical
retractor is seen on anterior projection. On the lateral
projections, surgical instrument is seen overlying the screws which
was deliberately placed in image by surgeon to allow comparison. No
other radiopaque foreign body is noted.
IMPRESSION: Two surgical screws are seen as well as stabilization cage in lower
cervical spine, as well as a surgical instrument seen on the 2
lateral projections which was deliberately placed by surgeon to
allow comparison. No other definite radiopaque foreign body is
noted. These results were called by telephone at the time of
interpretation on 03/21/2018 at [DATE] to Dr. LAOISHA BADAT , who
verbally acknowledged these results.

## 2019-06-30 IMAGING — RF DG CERVICAL SPINE 2 OR 3 VIEWS
1 series · 2 of 2 positions shown · non-contrast
Comparison: 03/21/2018.

CLINICAL DATA: Cervical spine surgery.

EXAM:
DG C-ARM 61-120 MIN; CERVICAL SPINE - 2-3 VIEW

[Series 1: run · 2 of 2 slices shown]
[im 1/2]
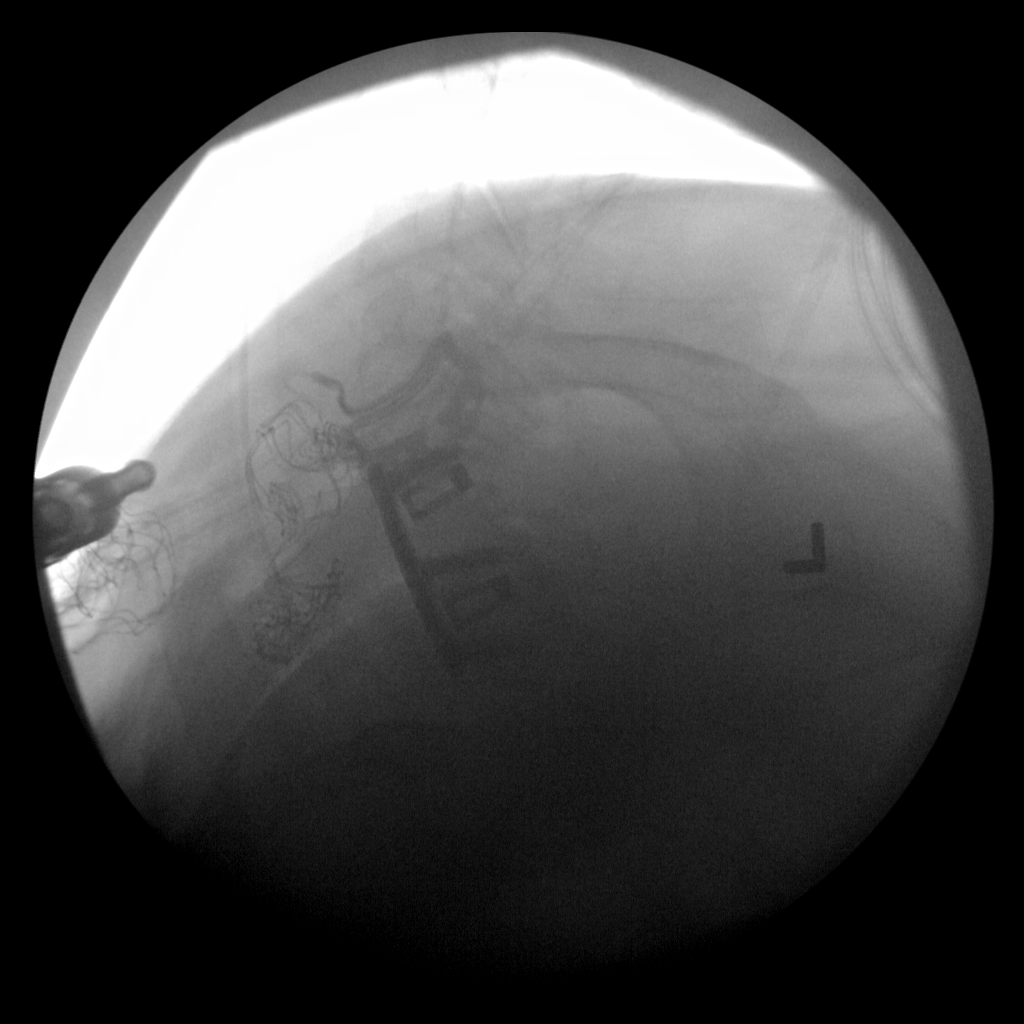
[im 2/2]
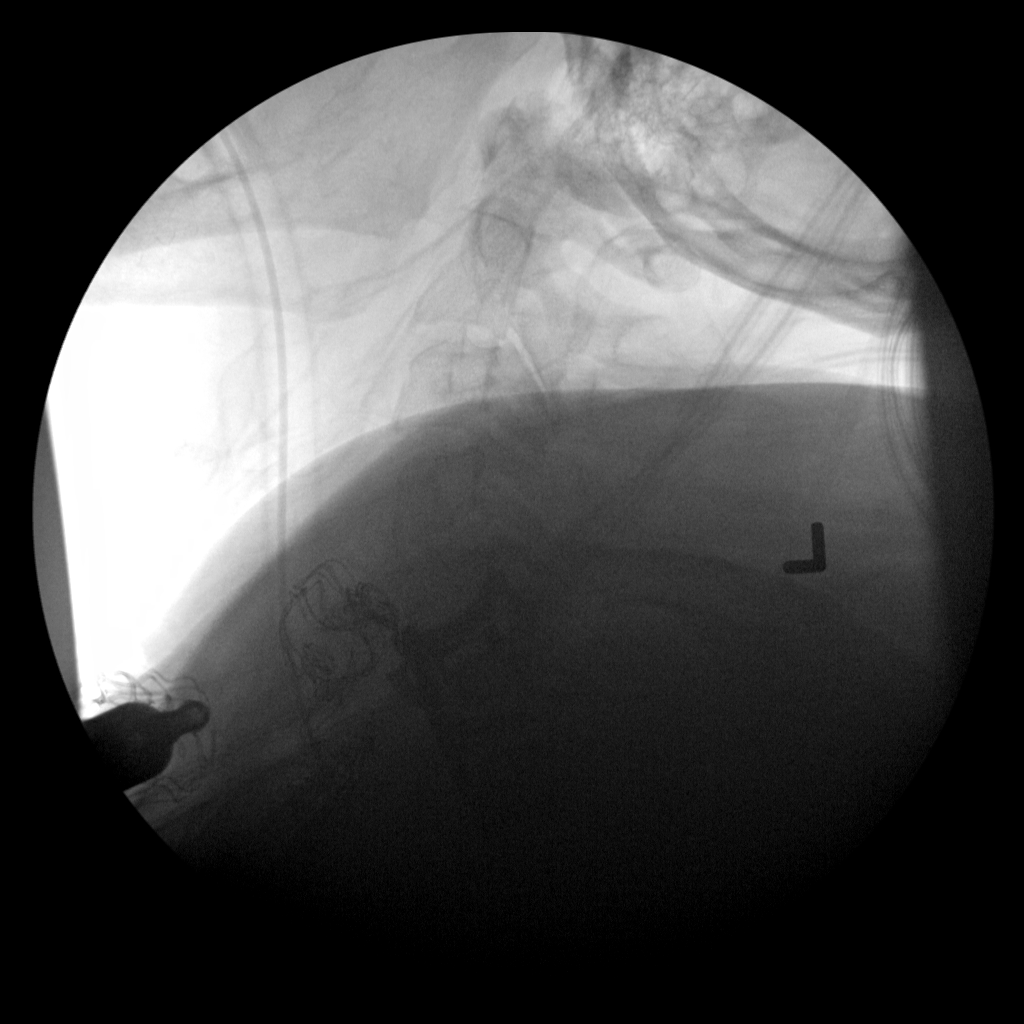

[2 of 2 positions shown; findings below may reference images not displayed]

FINDINGS: Prior lower cervical anterior interbody fusion. Hardware intact
anatomic. Surgical sponges noted over the anterior neck.
Endotracheal tube noted.
IMPRESSION: Prior lower anterior and interbody cervical spine fusion. Hardware
intact. Anatomic alignment.
